# Patient Record
Sex: Female | Born: 1945 | Race: White | Hispanic: No | Marital: Married | Smoking: Never smoker
Health system: Southern US, Community
[De-identification: ages and names within clinical notes are randomized; demographics above are authoritative.]

## PROBLEM LIST (undated history)

## (undated) DIAGNOSIS — Z8489 Family history of other specified conditions: Secondary | ICD-10-CM

## (undated) DIAGNOSIS — C801 Malignant (primary) neoplasm, unspecified: Secondary | ICD-10-CM

## (undated) DIAGNOSIS — Z8619 Personal history of other infectious and parasitic diseases: Secondary | ICD-10-CM

## (undated) DIAGNOSIS — E559 Vitamin D deficiency, unspecified: Secondary | ICD-10-CM

## (undated) HISTORY — PX: OTHER SURGICAL HISTORY: SHX169

## (undated) HISTORY — DX: Personal history of other infectious and parasitic diseases: Z86.19

## (undated) HISTORY — DX: Vitamin D deficiency, unspecified: E55.9

---

## 1998-04-02 ENCOUNTER — Ambulatory Visit (HOSPITAL_COMMUNITY): Admission: RE | Admit: 1998-04-02 | Discharge: 1998-04-02 | Payer: Self-pay | Admitting: *Deleted

## 1998-04-02 ENCOUNTER — Encounter: Payer: Self-pay | Admitting: *Deleted

## 1999-06-29 ENCOUNTER — Other Ambulatory Visit: Admission: RE | Admit: 1999-06-29 | Discharge: 1999-06-29 | Payer: Self-pay | Admitting: *Deleted

## 1999-07-08 ENCOUNTER — Ambulatory Visit (HOSPITAL_COMMUNITY): Admission: RE | Admit: 1999-07-08 | Discharge: 1999-07-08 | Payer: Self-pay | Admitting: *Deleted

## 1999-07-08 ENCOUNTER — Encounter: Payer: Self-pay | Admitting: *Deleted

## 2000-06-27 ENCOUNTER — Other Ambulatory Visit: Admission: RE | Admit: 2000-06-27 | Discharge: 2000-06-27 | Payer: Self-pay | Admitting: *Deleted

## 2001-09-06 ENCOUNTER — Other Ambulatory Visit: Admission: RE | Admit: 2001-09-06 | Discharge: 2001-09-06 | Payer: Self-pay | Admitting: *Deleted

## 2002-12-24 ENCOUNTER — Other Ambulatory Visit: Admission: RE | Admit: 2002-12-24 | Discharge: 2002-12-24 | Payer: Self-pay | Admitting: *Deleted

## 2003-11-06 LAB — HM COLONOSCOPY

## 2003-12-03 ENCOUNTER — Ambulatory Visit (HOSPITAL_COMMUNITY): Admission: RE | Admit: 2003-12-03 | Discharge: 2003-12-03 | Payer: Self-pay | Admitting: Gastroenterology

## 2004-06-25 ENCOUNTER — Other Ambulatory Visit: Admission: RE | Admit: 2004-06-25 | Discharge: 2004-06-25 | Payer: Self-pay | Admitting: *Deleted

## 2005-06-07 HISTORY — PX: COLONOSCOPY: SHX174

## 2007-03-16 ENCOUNTER — Other Ambulatory Visit: Admission: RE | Admit: 2007-03-16 | Discharge: 2007-03-16 | Payer: Self-pay | Admitting: Obstetrics & Gynecology

## 2010-02-16 ENCOUNTER — Ambulatory Visit: Payer: Self-pay | Admitting: Vascular Surgery

## 2010-05-19 ENCOUNTER — Ambulatory Visit: Payer: Self-pay | Admitting: Vascular Surgery

## 2010-06-22 ENCOUNTER — Ambulatory Visit
Admission: RE | Admit: 2010-06-22 | Discharge: 2010-06-22 | Payer: Self-pay | Source: Home / Self Care | Attending: Vascular Surgery | Admitting: Vascular Surgery

## 2010-06-30 ENCOUNTER — Ambulatory Visit: Admit: 2010-06-30 | Payer: Self-pay | Admitting: Vascular Surgery

## 2010-06-30 ENCOUNTER — Ambulatory Visit
Admission: RE | Admit: 2010-06-30 | Discharge: 2010-06-30 | Payer: Self-pay | Source: Home / Self Care | Attending: Vascular Surgery | Admitting: Vascular Surgery

## 2010-07-01 NOTE — Assessment & Plan Note (Signed)
OFFICE VISIT  DEVLYNN, KNOFF DOB:  1946-01-21                                       06/30/2010 [id number]#:[id number]  Patient returns 1 week post laser ablation of her left great saphenous vein with 10-20 stab phlebectomies for painful varicosities in the left thigh and calf.  She has already noted a traumatic decrease in the swelling of her distal calf and ankle.  She has had no pain at the stab phlebectomy sites.  She has had some mild to moderate discomfort at the ablation site in the thigh, both proximally and distally.  She was wearing elastic compression stocking as prescribed.  She took a diminished dose of her ibuprofen.  On physical exam, blood pressure 134/86, heart rate 85, respirations 20. She has some mild to moderate tenderness along the course of the great saphenous vein in the thigh with some mild ecchymosis and no distal edema and well-healed stab phlebectomy sites.  Venous duplex exam reveals closure of the left great saphenous vein up to just proximal to the saphenofemoral junction and no evidence of DVT.  She was reassured regarding these findings, will return in a few weeks to have a similar procedure performed on the contralateral right leg.    Quita Skye Hart [place], M.D. Electronically Signed  JDL/MEDQ  D:  06/30/2010  T:  07/01/2010  Job:  [id number]

## 2010-07-06 NOTE — Procedures (Unsigned)
DUPLEX DEEP VENOUS EXAM - LOWER EXTREMITY  INDICATION:  Left greater saphenous vein ablation.  HISTORY:  Edema:  No. Trauma/Surgery:  Yes. Pain:  Yes. PE:  No. Previous DVT:  No. Anticoagulants:  No. Other:  DUPLEX EXAM:               CFV   SFV   PopV  PTV    GSV               R  L  R  L  R  L  R   L  R  L Thrombosis    o  o     o     o      o     + Spontaneous   +  +     +     +      +     o Phasic        +  +     +     +      +     o Augmentation  +  +     +     +      +     o Compressible  +  +     +     +      +     o Competent     o  o     +     +      +     +  Legend:  + - yes  o - no  p - partial  D - decreased  IMPRESSION: 1. The left greater saphenous vein is thrombosed from the     saphenofemoral junction to the distal insertion site. 2. There is no evidence of deep venous thrombosis noted in the left     lower extremity.   _____________________________ Quita Skye Hart [place], M.D.  EM/MEDQ  D:  06/30/2010  T:  06/30/2010  Job:  [id number]

## 2010-07-27 ENCOUNTER — Other Ambulatory Visit (INDEPENDENT_AMBULATORY_CARE_PROVIDER_SITE_OTHER): Payer: BC Managed Care – PPO | Admitting: Vascular Surgery

## 2010-07-27 DIAGNOSIS — I83893 Varicose veins of bilateral lower extremities with other complications: Secondary | ICD-10-CM

## 2010-07-27 NOTE — Assessment & Plan Note (Signed)
OFFICE VISIT  Susan Taylor, Susan Taylor DOB:  July 30, 1945                                       07/27/2010 [id number]#:[id number]  The patient had laser ablation of her right great saphenous vein from the knee to the saphenofemoral junction with 10-20 stab phlebectomies in the proximal calf and pretibial area.  She tolerated the procedure well and will return in 1 week for venous duplex exam to confirm closure of the right great saphenous vein and this will complete her treatment session.    Quita Skye Hart [place], M.D. Electronically Signed  JDL/MEDQ  D:  07/27/2010  T:  07/27/2010  Job:  [id number]

## 2010-08-03 ENCOUNTER — Ambulatory Visit: Payer: Self-pay | Admitting: Vascular Surgery

## 2010-08-04 ENCOUNTER — Ambulatory Visit (INDEPENDENT_AMBULATORY_CARE_PROVIDER_SITE_OTHER): Payer: BC Managed Care – PPO | Admitting: Vascular Surgery

## 2010-08-04 ENCOUNTER — Encounter (INDEPENDENT_AMBULATORY_CARE_PROVIDER_SITE_OTHER): Payer: BC Managed Care – PPO

## 2010-08-04 DIAGNOSIS — I83893 Varicose veins of bilateral lower extremities with other complications: Secondary | ICD-10-CM

## 2010-08-04 DIAGNOSIS — Z48812 Encounter for surgical aftercare following surgery on the circulatory system: Secondary | ICD-10-CM

## 2010-08-05 NOTE — Assessment & Plan Note (Signed)
OFFICE VISIT  Susan Taylor, Susan Taylor DOB:  [birth year]                                       08/04/2010 EAVWU#:[id number]  The patient returns today 1 week post laser ablation of her right great saphenous vein with multiple stab phlebectomies for painful varicosities secondary to valvular reflux in the right great saphenous system.  She tolerated the procedure well and has had minimal to moderate discomfort in the right thigh, which is now resolving.  She also had some bruising in the right thigh.  She has had minimal discomfort in the stab phlebectomy sites below the knee.  She has had no distal edema but has worn her elastic compression stocking.  She took ibuprofen as instructed.  On exam today blood pressure 142/86, heart rate 72, respirations 20. Her right leg has some mild to moderate ecchymosis with the great saphenous vein palpable beneath the skin with mild tenderness.  There is no distal edema, and the stab phlebectomy sites have healed nicely.  I ordered a venous duplex exam today, which I reviewed and interpreted. There is total closure of the right great saphenous vein from the knee to the saphenofemoral junction with no evidence of DVT.  I reassured her regarding these findings.  She will wear the stockings for one more week and then as necessary in the future, return to see [place] on a p.r.n. basis.    Quita Skye Hart [place], M.D. Electronically Signed  JDL/MEDQ  D:  08/04/2010  T:  08/05/2010  Job:  [id number]

## 2010-08-11 NOTE — Procedures (Unsigned)
DUPLEX DEEP VENOUS EXAM - LOWER EXTREMITY  INDICATION:  Status post right GSV ablation.  HISTORY:  Edema:  Yes. Trauma/Surgery:  Ablation. Pain:  Yes. PE:  No. Previous DVT:  No. Anticoagulants: Other:  DUPLEX EXAM:               CFV   SFV   PopV  PTV    GSV               R  L  R  L  R  L  R   L  R  L Thrombosis    o  o  o     o     o      + Spontaneous   +  +  +     +     +      o Phasic        +  +  +     +     +      o Augmentation  +  +  +     +     +      o Compressible  +  +  +     +     +      o Competent     +  +  +     +     +      o  Legend:  + - yes  o - no  p - partial  D - decreased  IMPRESSION:  Apparently successful ablation of right greater saphenous vein without evidence of deep vein involvement.   _____________________________ Quita Skye Hart [place], M.D.  LT/MEDQ  D:  08/04/2010  T:  08/04/2010  Job:  [id number]

## 2010-10-20 NOTE — Consult Note (Signed)
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  These have  become increasingly symptomatic.  She stands all day during her job and  develops aching, throbbing, burning discomfort in the calves as the day  progresses.  She does not wear long-leg elastic compression stockings  and she is not able to elevate her legs at work, and does not take pain  medication.  She has no history of thrombophlebitis of deep venous  thrombosis, bleeding, or ulceration; but, mainly has been bothered by  increasing throbbing and aching discomfort as the day progresses.   CHRONIC MEDICAL PROBLEMS:  1. Hyperlipidemia.  2. Negative for diabetes, coronary artery disease, COPD, or stroke.   FAMILY HISTORY:  Positive for varicose veins in her mother and brother.  Negative for diabetes and stroke.   SOCIAL HISTORY:  She is married and has 2 children.  Works as an Psychologist, prison and probation services at [employer].  Has not smoked since 1970.  Drinks occasional alcohol.   REVIEW OF SYSTEMS:  Positive for colitis but otherwise all systems are  negative in the review of systems.   PHYSICAL EXAMINATION:  Blood pressure 124/81, heart rate 78, temperature  97.3.  General:  Well-developed, well-nourished female in no apparent  distress, alert and oriented x3.  HEENT exam is normal.  EOMs intact.  Lungs:  Clear to auscultation.  No rhonchi or wheezing.  Cardiovascular:  Regular rhythm.  No murmur.  Carotid pulses 3+, no bruits.  Abdomen:  Soft, nontender with no masses.  Musculoskeletal exam is free of major  deformities.  Neurologic exam is normal.  Lower extremity exam reveals  3+ femoral, popliteal, and dorsalis pedis pulses bilaterally.  Left leg  has bulging varicosities beginning at  the knee, medially over the great  saphenous system down to the ankle level and also posteriorly.  There is  minimal distal edema but a few reticular veins in the ankle.  The right  leg has a prominent pattern of bulging varicosities in the pretibial  area extending just below the knee in a C-shape around the pretibial  area.  She has some smaller varicosities in the distal calf on the  right.   Today, I ordered a venous duplex exam which I reviewed and interpreted.  She does have gross reflux in both great saphenous systems from the  saphenofemoral junction to the knee supplying these varicosities.  She  also has some reflux in the deep systems bilaterally.  Both small  saphenous veins are normal.   I think she does have venous insufficiency which is affecting her daily  living and ability to teach because of the symptoms which occur while  standing.  I have recommended long-leg elastic compression stockings (20  mm-30 mm gradient) as well as elevation at home when she can and  ibuprofen on a regular basis.  I will see her in 3 months.  If there has  been no improvement, I think we should proceed with:  1. Laser ablation of left great saphenous vein with multiple stab      phlebectomies to  be followed by #2.  2. Laser ablation of the right great saphenous vein with multiple stab      phlebectomies.     Quita Skye Hart Rochester, M.D.  Electronically Signed   JDL/MEDQ  D:  02/16/2010  T:  02/17/2010  Job:  [id number]

## 2010-10-20 NOTE — Assessment & Plan Note (Signed)
OFFICE VISIT   LEVITA, MONICAL  DOB:  June 24, 1945                                       05/19/2010  WUJWJ#:[id number]   Patient returns today for follow-up regarding her venous insufficiency  of both lower extremities.  She has painful bulging varicosities in both  legs in the distal thigh and medial calf areas on the right side in the  pretibial area, which have caused increasing aching, throbbing, burning  discomfort which worsens as the day progresses.  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  She has been wearing long-leg elastic  compression stockings (20-30 mm gradient).  She cannot elevate her legs  at work but does take pain medication and has no change in her  symptomatology, which continues to worsen.  She has no history of DVT or  thrombophlebitis or stasis ulcers.   On exam today, blood pressure 110/72, heart rate 64, respirations 24.  Chest:  Clear to auscultation.  Generally, she is a well-developed, well-  nourished female in no apparent distress.  Lower extremity exam on the  right revealed bulging varicosities in the distal most thigh, extending  anteriorly in the lateral to the knee and the pretibial area and on the  left in the great saphenous distribution below the knee.  There are  bulging varicosities with 1-2+ edema on the left, no edema on the right.   Venous duplex exam done at her last visit reveals gross reflux  throughout both lower extremity systems, including the saphenofemoral  junction with numerous varicosities..   I think the best plan for this lady who has a symptomatic varicosities,  which are clearly affecting her daily living and ability to work should  be:  1.  Laser ablation of the left great saphenous vein with multiple  stab phlebectomies to be followed by:  2.  Laser ablation of the right  great saphenous vein with multiple stab phlebectomies.   We will proceed with precertification for this in the near  future.     Quita Skye Hart Rochester, M.D.  Electronically Signed   JDL/MEDQ  D:  05/19/2010  T:  05/20/2010  Job:  [id number]

## 2010-10-20 NOTE — Procedures (Signed)
LOWER EXTREMITY VENOUS REFLUX EXAM   INDICATION:  Varicose veins.   EXAM:  Using color-flow imaging and pulse Doppler spectral analysis, the  right and left common femoral, superficial femoral, popliteal, posterior  tibial, greater and lesser saphenous veins are evaluated.  There is  evidence suggesting deep venous insufficiency in the right and left  lower extremities.   The right and left saphenofemoral junction is not competent with Reflux  of >584milliseconds. The right and left GSV is not competent with Reflux  of >583milliseconds with the caliber as described below.   The right and left proximal short saphenous vein demonstrates  competency.   GSV Diameter (used if found to be incompetent only)                                            Right    Left  Proximal Greater Saphenous Vein           0.66 cm  0.88 cm  Proximal-to-mid-thigh                     cm       cm  Mid thigh                                 0.44 cm  0.62 cm  Mid-distal thigh                          cm       cm  Distal thigh                              0.48 cm  0.60 cm  Knee                                      0.45 cm  0.64 cm   IMPRESSION:  1. The right and left greater saphenous veins are not competent with      reflux with >523milliseconds.  2. The right and left greater saphenous veins are not tortuous.  3. The deep venous system is not competent with Reflux of      >560milliseconds.  4. The right and left short saphenous vein is competent.   ___________________________________________  Quita Skye. Hart [place], M.D.   EM/MEDQ  D:  02/16/2010  T:  02/16/2010  Job:  [id number]

## 2010-10-20 NOTE — Assessment & Plan Note (Signed)
OFFICE VISIT   Susan Taylor, Susan Taylor  DOB:  04/23/1946                                       06/22/2010  RUEAV#:[id number]   The patient had laser ablation of her left great saphenous vein with 10  to 20 stab phlebectomies for painful varicosities secondary to saphenous  reflux.  She tolerated the procedure well, will return in 1 week for a  venous duplex exam to confirm closure and then will be scheduled to have  the contralateral right side done.     Quita Skye Hart Rochester, M.D.  Electronically Signed   JDL/MEDQ  D:  06/22/2010  T:  06/23/2010  Job:  [id number]

## 2010-10-23 NOTE — Op Note (Signed)
NAME:  Susan Taylor, Susan Taylor                        ACCOUNT NO.:  [id number]   MEDICAL RECORD NO.:  [id number]                   PATIENT TYPE:  AMB   LOCATION:  ENDO                                 FACILITY:  [facility]   PHYSICIAN:  Graylin Shiver, M.D.                DATE OF BIRTH:  20-Jan-1946   DATE OF PROCEDURE:  12/03/2003  DATE OF DISCHARGE:                                 OPERATIVE REPORT   PROCEDURE:  Colonoscopy.   INDICATIONS FOR PROCEDURE:  Screening.   Informed consent was obtained after explanation of the risks of bleeding,  infection and perforation.   PREMEDICATION:  Fentanyl 100 mcg IV, Versed 10 mg IV.   DESCRIPTION OF PROCEDURE:  With the patient in the left lateral decubitus  position, a rectal exam was performed and no masses were felt. The Olympus  pediatric adjustable colonoscope was inserted into the rectum and advanced  around an extremely tortuous colon to the cecum. The patient had to be  placed on her right side and on her back to achieved cecal intubation.  The  cecum and ascending colon were normal. The transverse colon was normal.  The  descending colon, sigmoid and rectum were normal. She tolerated the  procedure well without complications.   IMPRESSION:  Normal colonoscopy to the cecum.  The colon is extremely  tortuous.   I would recommend a followup colonoscopy again in 10 years.                                               Graylin Shiver, M.D.    Germain Osgood  D:  12/03/2003  T:  12/03/2003  Job:  [id number]   cc:   Marcene Duos, M.D.  [address]  [place]  [place] [address]  Fax: [phone]

## 2011-09-20 LAB — HM MAMMOGRAPHY

## 2012-02-14 ENCOUNTER — Encounter: Payer: Self-pay | Admitting: Internal Medicine

## 2012-02-14 ENCOUNTER — Telehealth: Payer: Self-pay | Admitting: Internal Medicine

## 2012-02-14 ENCOUNTER — Ambulatory Visit (INDEPENDENT_AMBULATORY_CARE_PROVIDER_SITE_OTHER): Payer: Medicare Other | Admitting: Internal Medicine

## 2012-02-14 VITALS — BP 134/80 | HR 98 | Temp 98.6°F | Ht 66.25 in | Wt 178.0 lb

## 2012-02-14 DIAGNOSIS — Z818 Family history of other mental and behavioral disorders: Secondary | ICD-10-CM

## 2012-02-14 DIAGNOSIS — R030 Elevated blood-pressure reading, without diagnosis of hypertension: Secondary | ICD-10-CM

## 2012-02-14 DIAGNOSIS — Z8249 Family history of ischemic heart disease and other diseases of the circulatory system: Secondary | ICD-10-CM

## 2012-02-14 DIAGNOSIS — Z820 Family history of epilepsy and other diseases of the nervous system: Secondary | ICD-10-CM

## 2012-02-14 DIAGNOSIS — Z7189 Other specified counseling: Secondary | ICD-10-CM

## 2012-02-14 DIAGNOSIS — Z7689 Persons encountering health services in other specified circumstances: Secondary | ICD-10-CM

## 2012-02-14 DIAGNOSIS — E7800 Pure hypercholesterolemia, unspecified: Secondary | ICD-10-CM

## 2012-02-14 NOTE — Patient Instructions (Addendum)
Check your blood pressure readings ocassionaly 1-3 x per week is plenty .       DASH  Diet DASH Diet The DASH diet stands for "Dietary Approaches to Stop Hypertension." It is a healthy eating plan that has been shown to reduce high blood pressure (hypertension) in as little as 14 days, while also possibly providing other significant health benefits. These other health benefits include reducing the risk of breast cancer after menopause and reducing the risk of type 2 diabetes, heart disease, colon cancer, and stroke. Health benefits also include weight loss and slowing kidney failure in patients with chronic kidney disease.  DIET GUIDELINES  Limit salt (sodium). Your diet should contain less than 1500 mg of sodium daily.   Limit refined or processed carbohydrates. Your diet should include mostly whole grains. Desserts and added sugars should be used sparingly.   Include small amounts of heart-healthy fats. These types of fats include nuts, oils, and tub margarine. Limit saturated and trans fats. These fats have been shown to be harmful in the body.  CHOOSING FOODS  The following food groups are based on a 2000 calorie diet. See your Registered Dietitian for individual calorie needs. Grains and Grain Products (6 to 8 servings daily)  Eat More Often: Whole-wheat bread, brown rice, whole-grain or wheat pasta, quinoa, popcorn without added fat or salt (air popped).   Eat Less Often: White bread, white pasta, white rice, cornbread.  Vegetables (4 to 5 servings daily)  Eat More Often: Fresh, frozen, and canned vegetables. Vegetables may be raw, steamed, roasted, or grilled with a minimal amount of fat.   Eat Less Often/Avoid: Creamed or fried vegetables. Vegetables in a cheese sauce.  Fruit (4 to 5 servings daily)  Eat More Often: All fresh, canned (in natural juice), or frozen fruits. Dried fruits without added sugar. One hundred percent fruit juice ( cup [237 mL] daily).   Eat Less Often:  Dried fruits with added sugar. Canned fruit in light or heavy syrup.  [employer], Fish, and Poultry (2 servings or less daily. One serving is 3 to 4 oz [85-114 g]).  Eat More Often: Ninety percent or leaner ground beef, tenderloin, sirloin. Round cuts of beef, chicken breast, [place] breast. All fish. Grill, bake, or broil your meat. Nothing should be fried.   Eat Less Often/Avoid: Fatty cuts of meat, [place], or chicken leg, thigh, or wing. Fried cuts of meat or fish.  Dairy (2 to 3 servings)  Eat More Often: Low-fat or fat-free milk, low-fat plain or light yogurt, reduced-fat or part-skim cheese.   Eat Less Often/Avoid: Milk (whole, 2%, skim, or chocolate).Whole milk yogurt. Full-fat cheeses.  Nuts, Seeds, and Legumes (4 to 5 servings per week)  Eat More Often: All without added salt.   Eat Less Often/Avoid: Salted nuts and seeds, canned beans with added salt.  Fats and Sweets (limited)  Eat More Often: Vegetable oils, tub margarines without trans fats, sugar-free gelatin. Mayonnaise and salad dressings.   Eat Less Often/Avoid: Coconut oils, palm oils, butter, stick margarine, cream, half and half, cookies, candy, pie.  FOR MORE INFORMATION The Dash Diet Eating Plan: www.dashdiet.org Document Released: 05/13/2011 Document Reviewed: 05/03/2011 [facility] Patient Information 2012 [place], [place].   Flu shot   Shingles vaccine Pneumovax  All vaccines  Advised call for  Injection only appt for this when ready.  Lab and OV in 6 months or as needed.  Check  Into last labs for [place] to review.

## 2012-02-14 NOTE — Telephone Encounter (Signed)
What labs did you order?

## 2012-02-14 NOTE — Telephone Encounter (Signed)
Pt is sch for fasting labs please put order in system

## 2012-02-14 NOTE — Progress Notes (Signed)
  Subjective:    Patient ID: Susan Taylor, female    DOB: [birth year], 66 y.o.   MRN: [id number]  HPI Patient comes in as new patient visit . Previous care was  Deboraha Sprang  Last seen over a year ago.  Has large amt of doc turnover and  Not seen recetly. See Dr Thurnell Lose  .  On no rx meds at this time. 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  Concerned of risk .  Has hx of high  ldl and hdl      BP readings are reported nl at  Home and no dx of ht but has been up at times  [facility] at Dr Rosalio Macadamia office in the past year.  Says she is utd on colon 7=8 years ago pap this yea ras well as mammo.  Review of Systems ROS:  GEN/ HEENT: No fever, significant weight changes sweats headaches vision problems hearing changes, CV/ PULM; No chest pain shortness of breath cough, syncope,edema  change in exercise tolerance. GI /GU: No adominal pain, vomiting, change in bowel habits. No blood in the stool. No significant GU symptoms. SKIN/HEME: ,no acute skin rashes suspicious lesions or bleeding. No lymphadenopathy, nodules, masses.  NEURO/ PSYCH:  No neurologic signs such as weakness numbness. No depression anxiety. IMM/ Allergy: No unusual infections.  Allergy .   REST of 12 system review negative except as per HPI Past history family history social history data entered  in the electronic medical record.      Objective:   Physical Exam BP 134/80  Pulse 98  Temp 98.6 F (37 C) (Oral)  Ht 5' 6.25" (1.683 m)  Wt 178 lb (80.74 kg)  BMI 28.51 kg/m2  SpO2 98%  Repeat BP right  140/84  Right and 134/80 WDWN healthy appearing in nad HEENT grossly nl op clear eyes clear  Neck: Supple without adenopathy or masses or bruits Chest:  Clear to A&P without wheezes rales or rhonchi CV:  S1-S2 no gallops or murmurs peripheral perfusion is normal Abdomen:  Sof,t normal bowel sounds without  hepatosplenomegaly, no guarding rebound or masses no CVA tenderness No clubbing cyanosis or edema MS mild scoliosis   noacute findings  Neuro NON focal grossly  Gait normal .     Assessment & Plan:   Bp elevation s poss white coat.  Reviewed data infor  on dash has fam hx of poss vascular dementia and ht   caretaking  Role new LiPIDS:  Apparently  Ok ratio  But should get check  In next year  Get copy of her last labs to [place].  Family hx of vascular dementia and ht.  HCM  Disc  immuniz flu zostavax and pneumovax    Pt will wait and reschedule later.  Counseled. lifestyle intervention healthy eating and exercise . Dash diet   bp monitoring  And risk reduction . Plan ov in about  4-6 months and labs previsit . Contact us in interim if elevated bp readings at home.

## 2012-02-16 NOTE — Telephone Encounter (Signed)
I put  Future orders in the system.

## 2012-02-17 NOTE — Telephone Encounter (Signed)
done

## 2012-03-06 ENCOUNTER — Encounter: Payer: Self-pay | Admitting: Internal Medicine

## 2012-03-06 DIAGNOSIS — Z8249 Family history of ischemic heart disease and other diseases of the circulatory system: Secondary | ICD-10-CM | POA: Insufficient documentation

## 2012-03-06 DIAGNOSIS — Z818 Family history of other mental and behavioral disorders: Secondary | ICD-10-CM | POA: Insufficient documentation

## 2012-03-06 NOTE — Assessment & Plan Note (Signed)
Monitor lsi fu treatment as appropriate if not at goal

## 2012-08-08 ENCOUNTER — Other Ambulatory Visit: Payer: BC Managed Care – PPO

## 2012-08-15 ENCOUNTER — Ambulatory Visit: Payer: BC Managed Care – PPO | Admitting: Internal Medicine

## 2012-11-14 ENCOUNTER — Encounter: Payer: Self-pay | Admitting: Obstetrics & Gynecology

## 2012-11-14 ENCOUNTER — Ambulatory Visit (INDEPENDENT_AMBULATORY_CARE_PROVIDER_SITE_OTHER): Payer: Medicare PPO | Admitting: Obstetrics & Gynecology

## 2012-11-14 VITALS — BP 120/84 | HR 76 | Temp 98.0°F | Resp 16 | Ht 66.5 in | Wt 174.0 lb

## 2012-11-14 DIAGNOSIS — N8111 Cystocele, midline: Secondary | ICD-10-CM

## 2012-11-14 DIAGNOSIS — Z01419 Encounter for gynecological examination (general) (routine) without abnormal findings: Secondary | ICD-10-CM

## 2012-11-14 DIAGNOSIS — IMO0002 Reserved for concepts with insufficient information to code with codable children: Secondary | ICD-10-CM

## 2012-11-14 NOTE — Patient Instructions (Signed)

## 2012-11-14 NOTE — Progress Notes (Signed)
Patient ID: Susan Taylor, female   DOB: [birth year], 67 y.o.   MRN: [id number]  67 y.o. G2P2 MarriedCaucasianF here for annual exam.  Doing well.  No vaginal bleeding.  Has appointment on thurs with Dr. Fabian Sharp.  No LMP recorded. Patient is postmenopausal.          Sexually active: yes  The current method of family planning is post menopausal status.    Exercising: yes  Walking 5-7 days per week Smoker:  no  Health Maintenance: Pap:  07/26/11 History of abnormal Pap:  no MMG:  09/20/11 Colonoscopy:  11/2003, repeat 10 years BMD:   04/10/10, -1.4 hip frax 1.3%/14% TDaP:  03/2007 Screening Labs: PCP, Hb today: PCP, Urine today: PCP   reports that she has never smoked. She has never used smokeless tobacco. She reports that she drinks about 0.5 ounces of alcohol per week. She reports that she does not use illicit drugs.  Past Medical History  Diagnosis Date  . Hx of varicella   . Vitamin D deficiency     Past Surgical History  Procedure Laterality Date  . Squamous cell carcinoma removal      Current Outpatient Prescriptions  Medication Sig Dispense Refill  . aspirin 81 MG tablet Take 81 mg by mouth daily.      . Calcium Carbonate (CALCIUM 500 PO) Take 1 tablet by mouth daily.      . Cholecalciferol (VITAMIN D PO) Take 1 tablet by mouth daily. Take 2000mg  daily.       No current facility-administered medications for this visit.    Family History  Problem Relation Age of Onset  . Arthritis Mother   . Hearing loss Mother   . Heart disease Mother   . Hyperlipidemia Mother   . Hypertension Mother   . Miscarriages / [place] Mother   . Stroke Mother   . Alcohol abuse Father     ROS:  Pertinent items are noted in HPI.  Otherwise, a comprehensive ROS was negative.  Exam:   BP 120/84  Pulse 76  Temp(Src) 98 F (36.7 C) (Oral)  Resp 16  Ht 5' 6.5" (1.689 m)  Wt 174 lb (78.926 kg)  BMI 27.67 kg/m2  Weight change:-3lb    Height: 5' 6.5" (168.9 cm)  Ht Readings from Last  3 Encounters:  11/14/12 5' 6.5" (1.689 m)  02/14/12 5' 6.25" (1.683 m)    General appearance: alert, cooperative and appears stated age Head: Normocephalic, without obvious abnormality, atraumatic Neck: no adenopathy, supple, symmetrical, trachea midline and thyroid normal to inspection and palpation Lungs: clear to auscultation bilaterally Breasts: normal appearance, no masses or tenderness Heart: regular rate and rhythm Abdomen: soft, non-tender; bowel sounds normal; no masses,  no organomegaly Extremities: extremities normal, atraumatic, no cyanosis or edema Skin: Skin color, texture, turgor normal. No rashes or lesions Lymph nodes: Cervical, supraclavicular, and axillary nodes normal. No abnormal inguinal nodes palpated Neurologic: Grossly normal   Pelvic: External genitalia:  no lesions              Urethra:  normal appearing urethra with no masses, tenderness or lesions              Bartholins and Skenes: normal                 Vagina: normal appearing vagina with normal color and discharge, no lesions, 2nd to 3rd degree cystocele noted today on exam  Cervix: no lesions              Pap taken: no Bimanual Exam:  Uterus:  normal size, contour, position, consistency, mobility, non-tender              Adnexa: normal adnexa and no mass, fullness, tenderness               Rectovaginal: Confirms               Anus:  normal sphincter tone, no lesions  A:  Well Woman with normal exam H/O SCC right lower extremity 11/12 Cystocele noted today on exam.  D/W pt this finding.  Brochure on pelvic support problems provided.  PT discussed.  Declines today.  Will monitor for symptoms changes. Mild osteopenia  P:   Mammogram scheduled today for patient--6/12 at 2:45pm Labs later this week with Dr. Fabian Sharp pap smear not done. Neg with neg HR HPV 2/13. return annually or prn  An After Visit Summary was printed and given to the patient.

## 2012-11-16 ENCOUNTER — Other Ambulatory Visit (INDEPENDENT_AMBULATORY_CARE_PROVIDER_SITE_OTHER): Payer: Medicare PPO

## 2012-11-16 DIAGNOSIS — E7800 Pure hypercholesterolemia, unspecified: Secondary | ICD-10-CM

## 2012-11-16 DIAGNOSIS — R030 Elevated blood-pressure reading, without diagnosis of hypertension: Secondary | ICD-10-CM

## 2012-11-16 DIAGNOSIS — Z820 Family history of epilepsy and other diseases of the nervous system: Secondary | ICD-10-CM

## 2012-11-16 DIAGNOSIS — Z818 Family history of other mental and behavioral disorders: Secondary | ICD-10-CM

## 2012-11-16 DIAGNOSIS — Z8249 Family history of ischemic heart disease and other diseases of the circulatory system: Secondary | ICD-10-CM

## 2012-11-16 LAB — BASIC METABOLIC PANEL
Chloride: 102 mEq/L (ref 96–112)
Potassium: 4.9 mEq/L (ref 3.5–5.1)
Sodium: 137 mEq/L (ref 135–145)

## 2012-11-16 LAB — CBC WITH DIFFERENTIAL/PLATELET
Basophils Relative: 0.5 % (ref 0.0–3.0)
Eosinophils Absolute: 0.1 10*3/uL (ref 0.0–0.7)
Hemoglobin: 14.4 g/dL (ref 12.0–15.0)
MCHC: 33.7 g/dL (ref 30.0–36.0)
MCV: 95.7 fl (ref 78.0–100.0)
Monocytes Absolute: 0.4 10*3/uL (ref 0.1–1.0)
Neutro Abs: 2.5 10*3/uL (ref 1.4–7.7)
RBC: 4.47 Mil/uL (ref 3.87–5.11)
RDW: 13.2 % (ref 11.5–14.6)

## 2012-11-16 LAB — LIPID PANEL
Cholesterol: 242 mg/dL — ABNORMAL HIGH (ref 0–200)
Triglycerides: 51 mg/dL (ref 0.0–149.0)
VLDL: 10.2 mg/dL (ref 0.0–40.0)

## 2012-11-16 LAB — HEPATIC FUNCTION PANEL
Albumin: 4.4 g/dL (ref 3.5–5.2)
Total Protein: 7.2 g/dL (ref 6.0–8.3)

## 2012-11-16 LAB — LDL CHOLESTEROL, DIRECT: Direct LDL: 143.2 mg/dL

## 2012-11-23 ENCOUNTER — Ambulatory Visit (INDEPENDENT_AMBULATORY_CARE_PROVIDER_SITE_OTHER): Payer: Medicare PPO | Admitting: Internal Medicine

## 2012-11-23 ENCOUNTER — Encounter: Payer: Self-pay | Admitting: Internal Medicine

## 2012-11-23 VITALS — BP 130/86 | HR 75 | Temp 97.9°F | Wt 172.0 lb

## 2012-11-23 DIAGNOSIS — Z8249 Family history of ischemic heart disease and other diseases of the circulatory system: Secondary | ICD-10-CM

## 2012-11-23 DIAGNOSIS — E7800 Pure hypercholesterolemia, unspecified: Secondary | ICD-10-CM

## 2012-11-23 DIAGNOSIS — R030 Elevated blood-pressure reading, without diagnosis of hypertension: Secondary | ICD-10-CM

## 2012-11-23 NOTE — Patient Instructions (Addendum)
Continue lifestyle intervention healthy eating and exercise . Continue monitor BP readings  As we discussed.  Consider pneumovax  As we discussed  Get yearly flu vaccine. Look into shingles vaccine  zostavax   Coverage and can return for  This at any time .  Consider  Cross training for exercise.   See dr fields sports medicine if having difficulty maintaining exercise .  Can monitor labs every few years   . Blood pressure readings every year.

## 2012-11-23 NOTE — Progress Notes (Signed)
Chief Complaint  Patient presents with  . Follow-up    HPI: Pt comes in today for "FU"   Seen one last year and fu with screening labs  And fu of bp and lipids. Has hx of elevated lipids in family and personal . But no personal cv disease. Has had her gyne check in the meantime  Today feeling fine  . hasnt checked bp recently as it had been good . Fu lab review ; No cv pulm sx or exercise limitation exceptrelated to old foot injury. Foot  Hx of fracture.  Popping  And gets sore with lots of  Walking .   ROS: See pertinent positives and negatives per HPI. No cv pulm gi gu sx ent sx at present Functional status good  All adls no dep anxiety   Past Medical History  Diagnosis Date  . Hx of varicella   . Vitamin D deficiency     Family History  Problem Relation Age of Onset  . Arthritis Mother   . Hearing loss Mother   . Heart disease Mother   . Hyperlipidemia Mother   . Hypertension Mother   . Miscarriages / [place] Mother   . Stroke Mother   . Alcohol abuse Father     History   Social History  . Marital Status: Married    Spouse Name: N/A    Number of Children: N/A  . Years of Education: N/A   Social History Main Topics  . Smoking status: Never Smoker   . Smokeless tobacco: Never Used  . Alcohol Use: .5 - 2 oz/week    1-4 drink(s) per week  . Drug Use: No  . Sexually Active: Yes -- Female partner(s)    Birth Control/ Protection: Post-menopausal   Other Topics Concern  . 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 3 miles    G2P2   7-8 hours sleep   Social etoh 1-4 per week              Outpatient Encounter Prescriptions as of 11/23/2012  Medication Sig Dispense Refill  . aspirin 81 MG tablet Take 81 mg by mouth daily.      . Calcium Carbonate (CALCIUM 500 PO) Take 1 tablet by mouth daily.      .  Cholecalciferol (VITAMIN D PO) Take 1 tablet by mouth daily. Take 2000mg  daily.       No facility-administered encounter medications on file as of 11/23/2012.    EXAM:  BP 130/86  Pulse 75  Temp(Src) 97.9 F (36.6 C) (Oral)  Wt 172 lb (78.019 kg)  BMI 27.35 kg/m2  SpO2 98%  Body mass index is 27.35 kg/(m^2).  GENERAL: vitals reviewed and listed above, alert, oriented, appears well hydrated and in no acute distress Repeat bp better right tarm sitting  HEENT: atraumatic, conjunctiva  clear, no obvious abnormalities on inspection of external nose and ears  NECK: no obvious masses on inspection palpation  noadenopathy CV: HRRR, no clubbing cyanosis or  peripheral edema nl cap refill  MS: moves all extremities without noticeable focal grossly  Abnormality gail normal  PSYCH: pleasant and cooperative, no obvious depression or anxiety Lab Results  Component Value Date   WBC 4.9 11/16/2012   HGB  14.4 11/16/2012   HCT 42.8 11/16/2012   PLT 256.0 11/16/2012   GLUCOSE 87 11/16/2012   CHOL 242* 11/16/2012   TRIG 51.0 11/16/2012   HDL 96.90 11/16/2012   LDLDIRECT 143.2 11/16/2012   ALT 20 11/16/2012   AST 24 11/16/2012   NA 137 11/16/2012   K 4.9 11/16/2012   CL 102 11/16/2012   CREATININE 0.9 11/16/2012   BUN 16 11/16/2012   CO2 23 11/16/2012   TSH 3.17 11/16/2012    ASSESSMENT AND PLAN:  Discussed the following assessment and plan:  Elevated cholesterol - hihg hdl also  LSI  to attend  Family history of vascular disease mom   Elevated blood pressure - better on repeat  contine life style  habits  Counseled. About immunization advise per protochol   Pneumovax will wait on this  And shingles etc  tdap ? May not be reimbursed but medicare plans   -Patient advised to return or notify health care team  if symptoms worsen or persist or new concerns arise.  Patient Instructions  Continue lifestyle intervention healthy eating and exercise . Continue monitor BP readings  As we discussed.   Consider pneumovax  As we discussed  Get yearly flu vaccine. Look into shingles vaccine  zostavax   Coverage and can return for  This at any time .  Consider  Cross training for exercise.   See dr fields sports medicine if having difficulty maintaining exercise .  Can monitor labs every few years   . Blood pressure readings every year.        Neta Mends. Peggi Yono M.D. Total visit > 50% spent counseling and coordinating care

## 2014-01-01 ENCOUNTER — Ambulatory Visit: Payer: Medicare PPO | Admitting: Obstetrics & Gynecology

## 2014-04-08 ENCOUNTER — Encounter: Payer: Self-pay | Admitting: Internal Medicine

## 2014-07-12 ENCOUNTER — Encounter: Payer: Self-pay | Admitting: Family Medicine

## 2014-07-12 ENCOUNTER — Telehealth: Payer: Self-pay | Admitting: Internal Medicine

## 2014-07-12 ENCOUNTER — Ambulatory Visit (INDEPENDENT_AMBULATORY_CARE_PROVIDER_SITE_OTHER): Payer: Medicare PPO | Admitting: Family Medicine

## 2014-07-12 VITALS — BP 148/98 | HR 80 | Temp 98.3°F | Ht 66.5 in | Wt 180.0 lb

## 2014-07-12 DIAGNOSIS — H00016 Hordeolum externum left eye, unspecified eyelid: Secondary | ICD-10-CM

## 2014-07-12 MED ORDER — NEOMYCIN-POLYMYXIN-DEXAMETH 3.5-10000-0.1 OP SUSP: 3.0000 [drp] | OPHTHALMIC | Status: DC | PRN

## 2014-07-12 MED ORDER — NEOMYCIN-POLYMYXIN-HC 3.5-10000-1 OP SUSP: 3.0000 [drp] | OPHTHALMIC | Status: DC

## 2014-07-12 NOTE — Progress Notes (Signed)
Pre visit review using our clinic review tool, if applicable. No additional management support is needed unless otherwise documented below in the visit note. 

## 2014-07-12 NOTE — Progress Notes (Signed)
   Subjective:    Patient ID: Susan Taylor, female    DOB: 1946-01-18, 69 y.o.   MRN: [id number]  HPI Here for one week of a painful swelling in the left lower lid. She thinks it is a stye. Using hot compresses several times a day. It does drain at times.    Review of Systems  Constitutional: Negative.   Eyes: Positive for pain, discharge and redness. Negative for photophobia and visual disturbance.       Objective:   Physical Exam  Constitutional: She appears well-developed and well-nourished.  Eyes:  The left lower lid is mildly swollen and the there is a small tender stye on the inside of the lid   Lymphadenopathy:    She has no cervical adenopathy.          Assessment & Plan:  Use Cortisporin drops and continue the hot compresses.

## 2014-07-12 NOTE — Telephone Encounter (Signed)
Pharmacy does not have this medication in stock, can you change to Maxitrol, dosage & directions are the same. Per Dr. Sarajane Jews, okay to call in. I did call pharmacy and give the verbal order for the change.

## 2014-07-12 NOTE — Telephone Encounter (Signed)
Pharm is calling needing clarification on cortisporin

## 2015-03-19 DIAGNOSIS — Z85828 Personal history of other malignant neoplasm of skin: Secondary | ICD-10-CM | POA: Diagnosis not present

## 2015-03-19 DIAGNOSIS — D485 Neoplasm of uncertain behavior of skin: Secondary | ICD-10-CM | POA: Diagnosis not present

## 2015-03-19 DIAGNOSIS — L570 Actinic keratosis: Secondary | ICD-10-CM | POA: Diagnosis not present

## 2015-03-19 DIAGNOSIS — D225 Melanocytic nevi of trunk: Secondary | ICD-10-CM | POA: Diagnosis not present

## 2015-03-19 DIAGNOSIS — Z872 Personal history of diseases of the skin and subcutaneous tissue: Secondary | ICD-10-CM | POA: Diagnosis not present

## 2015-03-19 DIAGNOSIS — L719 Rosacea, unspecified: Secondary | ICD-10-CM | POA: Diagnosis not present

## 2015-03-19 DIAGNOSIS — L821 Other seborrheic keratosis: Secondary | ICD-10-CM | POA: Diagnosis not present

## 2015-04-16 ENCOUNTER — Ambulatory Visit (INDEPENDENT_AMBULATORY_CARE_PROVIDER_SITE_OTHER): Payer: Medicare PPO

## 2015-04-16 DIAGNOSIS — Z23 Encounter for immunization: Secondary | ICD-10-CM | POA: Diagnosis not present

## 2015-06-11 ENCOUNTER — Other Ambulatory Visit (INDEPENDENT_AMBULATORY_CARE_PROVIDER_SITE_OTHER): Payer: Medicare Other

## 2015-06-11 DIAGNOSIS — Z0000 Encounter for general adult medical examination without abnormal findings: Secondary | ICD-10-CM | POA: Diagnosis not present

## 2015-06-11 DIAGNOSIS — E785 Hyperlipidemia, unspecified: Secondary | ICD-10-CM | POA: Diagnosis not present

## 2015-06-11 LAB — CBC WITH DIFFERENTIAL/PLATELET
BASOS PCT: 0.6 % (ref 0.0–3.0)
Basophils Absolute: 0 10*3/uL (ref 0.0–0.1)
EOS PCT: 2.1 % (ref 0.0–5.0)
Eosinophils Absolute: 0.1 10*3/uL (ref 0.0–0.7)
HEMATOCRIT: 41.6 % (ref 36.0–46.0)
HEMOGLOBIN: 14.1 g/dL (ref 12.0–15.0)
LYMPHS PCT: 39.2 % (ref 12.0–46.0)
Lymphs Abs: 1.9 10*3/uL (ref 0.7–4.0)
MCHC: 34 g/dL (ref 30.0–36.0)
MCV: 93.6 fl (ref 78.0–100.0)
MONOS PCT: 7.9 % (ref 3.0–12.0)
Monocytes Absolute: 0.4 10*3/uL (ref 0.1–1.0)
Neutro Abs: 2.4 10*3/uL (ref 1.4–7.7)
Neutrophils Relative %: 50.2 % (ref 43.0–77.0)
Platelets: 279 10*3/uL (ref 150.0–400.0)
RBC: 4.44 Mil/uL (ref 3.87–5.11)
RDW: 13.2 % (ref 11.5–15.5)
WBC: 4.8 10*3/uL (ref 4.0–10.5)

## 2015-06-11 LAB — BASIC METABOLIC PANEL
BUN: 13 mg/dL (ref 6–23)
CHLORIDE: 106 meq/L (ref 96–112)
CO2: 28 mEq/L (ref 19–32)
Calcium: 10 mg/dL (ref 8.4–10.5)
Creatinine, Ser: 0.91 mg/dL (ref 0.40–1.20)
GFR: 65.26 mL/min (ref >60.00)
Glucose, Bld: 110 mg/dL — ABNORMAL HIGH (ref 70–99)
POTASSIUM: 5.7 meq/L — AB (ref 3.5–5.1)
Sodium: 143 mEq/L (ref 135–145)

## 2015-06-11 LAB — HEPATIC FUNCTION PANEL
ALT: 10 U/L (ref 0–35)
AST: 16 U/L (ref 0–37)
Albumin: 4.4 g/dL (ref 3.5–5.2)
Alkaline Phosphatase: 62 U/L (ref 39–117)
Bilirubin, Direct: 0.1 mg/dL (ref 0.0–0.3)
TOTAL PROTEIN: 6.9 g/dL (ref 6.0–8.3)
Total Bilirubin: 0.5 mg/dL (ref 0.2–1.2)

## 2015-06-11 LAB — LIPID PANEL
CHOLESTEROL: 278 mg/dL — AB (ref 0–200)
HDL: 76.1 mg/dL (ref >39.00)
LDL Cholesterol: 183 mg/dL — ABNORMAL HIGH (ref 0–99)
NonHDL: 201.63
TRIGLYCERIDES: 91 mg/dL (ref 0.0–149.0)
Total CHOL/HDL Ratio: 4
VLDL: 18.2 mg/dL (ref 0.0–40.0)

## 2015-06-11 LAB — TSH: TSH: 4.47 u[IU]/mL (ref 0.35–4.50)

## 2015-06-18 ENCOUNTER — Ambulatory Visit (INDEPENDENT_AMBULATORY_CARE_PROVIDER_SITE_OTHER): Payer: Medicare Other | Admitting: Internal Medicine

## 2015-06-18 ENCOUNTER — Telehealth: Payer: Self-pay | Admitting: Internal Medicine

## 2015-06-18 ENCOUNTER — Encounter: Payer: Self-pay | Admitting: Internal Medicine

## 2015-06-18 VITALS — BP 148/94 | Temp 98.1°F | Ht 66.0 in | Wt 186.3 lb

## 2015-06-18 DIAGNOSIS — M8580 Other specified disorders of bone density and structure, unspecified site: Secondary | ICD-10-CM | POA: Diagnosis not present

## 2015-06-18 DIAGNOSIS — E2839 Other primary ovarian failure: Secondary | ICD-10-CM | POA: Diagnosis not present

## 2015-06-18 DIAGNOSIS — E785 Hyperlipidemia, unspecified: Secondary | ICD-10-CM

## 2015-06-18 DIAGNOSIS — Z0000 Encounter for general adult medical examination without abnormal findings: Secondary | ICD-10-CM | POA: Diagnosis not present

## 2015-06-18 DIAGNOSIS — IMO0001 Reserved for inherently not codable concepts without codable children: Secondary | ICD-10-CM

## 2015-06-18 DIAGNOSIS — Z1211 Encounter for screening for malignant neoplasm of colon: Secondary | ICD-10-CM

## 2015-06-18 DIAGNOSIS — R030 Elevated blood-pressure reading, without diagnosis of hypertension: Secondary | ICD-10-CM

## 2015-06-18 DIAGNOSIS — R7301 Impaired fasting glucose: Secondary | ICD-10-CM | POA: Diagnosis not present

## 2015-06-18 NOTE — Progress Notes (Signed)
Pre visit review using our clinic review tool, if applicable. No additional management support is needed unless otherwise documented below in the visit note.  Chief Complaint  Patient presents with  . Medicare Wellness    HPI: Susan Taylor 70 y.o. comes in today for Preventive Medicare wellness visit . Last visit with me was 2014  ggenerally well  No injury  See below   Health Maintenance  Topic Date Due  . ZOSTAVAX  01/13/2007  . DEXA SCAN  01/13/2012  . COLONOSCOPY  11/05/2013  . MAMMOGRAM  11/17/2014  . TETANUS/TDAP  06/17/2016 (Originally 01/12/1965)  . Hepatitis C Screening  06/17/2016 (Originally 01/27/1946)  . INFLUENZA VACCINE  01/06/2016   Health Maintenance Review LIFESTYLE:  TAD not ex smoker ocass tob  Sugar beverages: no Sleep: 9 hours  Exercises reg walks hour at a time    MEDICARE DOCUMENT QUESTIONS  TO SCAN     Hearing: okVision:  No limitations at present . Last eye check UTD  Safety:  Has smoke detector and wears seat belts.  No firearms. No excess sun exposure. Sees dentist regularly.  Falls: n  Advance directive :  Reviewed  Doesn't have one yet aware   Memory: Felt to be good  , no concern from her or her family.  Depression: No anhedonia unusual crying or depressive symptoms  Nutrition: Eats well balanced diet; adequate calcium and vitamin D. No swallowing chewing problems.  Injury: no major injuries in the last six months.  Other healthcare providers:  Reviewed today .  Social:  Lives with spouse married.   Preventive parameters: up-to-date  Reviewed   ADLS:   There are no problems or need for assistance  driving, feeding, obtaining food, dressing, toileting and bathing, managing money using phone. She is independent.   ROS:  When at [place] had sob going up lots of stepsposs altitude issues  None at reg altitude no cp  GEN/ HEENT: No fever, significant weight changes sweats headaches vision problems hearing changes, CV/  PULM; No chest pain shortness of breath cough, syncope,edema  change in exercise tolerance. GI /GU: No adominal pain, vomiting, change in bowel habits. No blood in the stool. No significant GU symptoms. SKIN/HEME: ,no acute skin rashes suspicious lesions or bleeding. No lymphadenopathy, nodules, masses.  NEURO/ PSYCH:  No neurologic signs such as weakness numbness. No depression anxiety. IMM/ Allergy: No unusual infections.  Allergy .   REST of 12 system review negative except as per HPI   Past Medical History  Diagnosis Date  . Hx of varicella   . Vitamin D deficiency     Family History  Problem Relation Age of Onset  . Arthritis Mother   . Hearing loss Mother   . Heart disease Mother   . Hyperlipidemia Mother   . Hypertension Mother   . Miscarriages / [place] Mother   . Stroke Mother   . Alcohol abuse Father     Social History   Social History  . Marital Status: Married    Spouse Name: N/A  . Number of Children: N/A  . Years of Education: N/A   Social History Main Topics  . Smoking status: Never Smoker   . Smokeless tobacco: Never Used  . Alcohol Use: Yes     Comment: occ  . Drug Use: No  . Sexual Activity:    Partners: Male    Birth Control/ Protection: Post-menopausal   Other Topics Concern  . None   Social History  Narrative   hh of 3  Husband and 30 yo mom with vasc dementia  But healthy    Married    Retired  Pharmacist, [employer]   bs in Agricultural consultant   No pets   Walks exercises 3 x per week. 3 miles    G2P2   7-8 hours sleep   Social etoh 1-4 per week              Outpatient Encounter Prescriptions as of 06/18/2015  Medication Sig  . aspirin 81 MG tablet Take 81 mg by mouth daily.  . Calcium Carbonate (CALCIUM 500 PO) Take 1 tablet by mouth daily.  . Cholecalciferol (VITAMIN D PO) Take 1 tablet by mouth daily. Take '2000mg'$  daily.  . [DISCONTINUED] neomycin-polymyxin b-dexamethasone (MAXITROL) 3.5-10000-0.1 SUSP Place 3 drops into the left eye  every 4 (four) hours as needed.  . [DISCONTINUED] neomycin-polymyxin-hydrocortisone (CORTISPORIN) 3.5-10000-1 ophthalmic suspension Place 3 drops into the left eye every 4 (four) hours.   No facility-administered encounter medications on file as of 06/18/2015.    EXAM:  BP 148/94 mmHg  Temp(Src) 98.1 F (36.7 C) (Oral)  Ht '5\' 6"'$  (1.676 m)  Wt 186 lb 4.8 oz (84.505 kg)  BMI 30.08 kg/m2  Body mass index is 30.08 kg/(m^2).  Physical Exam: Vital signs reviewed JOA:CZYS is a well-developed well-nourished alert cooperative   who appears stated age in no acute distress.  HEENT: normocephalic atraumatic , Eyes: PERRL EOM's full, conjunctiva clear, Nares: paten,t no deformity discharge or tenderness., Ears: no deformity EAC's clear TMs with normal landmarks. Mouth: clear OP, no lesions, edema.  Moist mucous membranes. Dentition in adequate repair. NECK: supple without masses, thyromegaly or bruits. CHEST/PULM:  Clear to auscultation and percussion breath sounds equal no wheeze , rales or rhonchi. No chest wall deformities or tenderness.Breast: normal by inspection . No dimpling, discharge, masses, tenderness or discharge . CV: PMI is nondisplaced, S1 S2 no gallops, murmurs, rubs. Peripheral pulses are full without delay.No JVD .  ABDOMEN: Bowel sounds normal nontender  No guard or rebound, no hepato splenomegal no CVA tenderness.   Extremtities:  No clubbing cyanosis or edema, no acute joint swelling or redness no focal atrophy NEURO:  Oriented x3, cranial nerves 3-12 appear to be intact, no obvious focal weakness,gait within normal limits no abnormal reflexes or asymmetrical SKIN: No acute rashes normal turgor, color, no bruising or petechiae. PSYCH: Oriented, good eye contact, no obvious depression anxiety, cognition and judgment appear normal. LN: no cervical axillary inguinal adenopathy No noted deficits in memory, attention, and speech.   Lab Results  Component Value Date   WBC 4.8  06/11/2015   HGB 14.1 06/11/2015   HCT 41.6 06/11/2015   PLT 279.0 06/11/2015   GLUCOSE 110* 06/11/2015   CHOL 278* 06/11/2015   TRIG 91.0 06/11/2015   HDL 76.10 06/11/2015   LDLDIRECT 143.2 11/16/2012   LDLCALC 183* 06/11/2015   ALT 10 06/11/2015   AST 16 06/11/2015   NA 143 06/11/2015   K 5.7* 06/11/2015   CL 106 06/11/2015   CREATININE 0.91 06/11/2015   BUN 13 06/11/2015   CO2 28 06/11/2015   TSH 4.47 06/11/2015    ASSESSMENT AND PLAN:  Discussed the following assessment and plan:  Visit for preventive health examination - ho on advanced directibe  Medicare annual wellness visit, subsequent  HLD (hyperlipidemia) - last check was very good high hdl now ldl 180 range lsi and repeat in 4 months at [facility]   Fasting hyperglycemia -  neg fam hx exercises will work on diet changes  Estrogen deficiency - Plan: DG Bone Density  Osteopenia - reported in past  on ca vit d  no fracture - Plan: DG Bone Density  Colon cancer screening - due fo coloscopy neg fam hx ifob this year until gets  - Plan: Fecal occult blood, imunochemical  Elevated BP - family hx  ht has been ok ? get home readings adn fu with monitor check  Colon cnacer screening  mammo to make appt  dexa ... Osteopenia .   No fracture    Get updated dexa .   To check w insurance about zostavax decline hep c screen Patient Care Team: Burnis Medin, MD as PCP - General (Internal Medicine) Megan Salon, MD as Attending Physician (Obstetrics and Gynecology) Crista Luria, MD as Attending Physician (Dermatology)  Patient Instructions  Schedule dexa bone desnity scan  Get stool tests  For colon cancer screening  choesterol and blood sugar is up . Avoid animal fats , trans fats simple sugars sweets  and carbohydrates. Processed foods   Mediterranean type diet is helpful to prevent diabetes. Take blood pressure readings twice a day for 7- 10 days and then periodically .To ensure below 140/90   . Bring in readings and  monitor to your next visit  . Plan  Hg a1c and fasting lipids and  Blood sugar in 3-4 months  Before OV  To see if improved.      Healthy lifestyle includes : At least 150 minutes of exercise weeks  , weight at healthy levels, which is usually   BMI 19-25. Avoid trans fats and processed foods;  Increase fresh fruits and veges to 5 servings per day. And avoid sweet beverages including tea and juice. Mediterranean diet with olive oil and nuts have been noted to be heart and brain healthy . Avoid tobacco products . Limit  alcohol to  7 per week for women and 14 servings for men.  Get adequate sleep . Wear seat belts . Don't text and drive .     Health Maintenance, Female Adopting a healthy lifestyle and getting preventive care can go a long way to promote health and wellness. Talk with your health care provider about what schedule of regular examinations is right for you. This is a good chance for you to check in with your provider about disease prevention and staying healthy. In between checkups, there are plenty of things you can do on your own. Experts have done a lot of research about which lifestyle changes and preventive measures are most likely to keep you healthy. Ask your health care provider for more information. WEIGHT AND DIET  Eat a healthy diet  Be sure to include plenty of vegetables, fruits, low-fat dairy products, and lean protein.  Do not eat a lot of foods high in solid fats, added sugars, or salt.  Get regular exercise. This is one of the most important things you can do for your health.  Most adults should exercise for at least 150 minutes each week. The exercise should increase your heart rate and make you sweat (moderate-intensity exercise).  Most adults should also do strengthening exercises at least twice a week. This is in addition to the moderate-intensity exercise.  Maintain a healthy weight  Body mass index (BMI) is a measurement that can be used to  identify possible weight problems. It estimates body fat based on height and weight. Your health care provider can help determine  your BMI and help you achieve or maintain a healthy weight.  For females 38 years of age and older:   A BMI below 18.5 is considered underweight.  A BMI of 18.5 to 24.9 is normal.  A BMI of 25 to 29.9 is considered overweight.  A BMI of 30 and above is considered obese.  Watch levels of cholesterol and blood lipids  You should start having your blood tested for lipids and cholesterol at 69 years of age, then have this test every 5 years.  You may need to have your cholesterol levels checked more often if:  Your lipid or cholesterol levels are high.  You are older than 69 years of age.  You are at high risk for heart disease.  CANCER SCREENING   Lung Cancer  Lung cancer screening is recommended for adults 2-87 years old who are at high risk for lung cancer because of a history of smoking.  A yearly low-dose CT scan of the lungs is recommended for people who:  Currently smoke.  Have quit within the past 15 years.  Have at least a 30-pack-year history of smoking. A pack year is smoking an average of one pack of cigarettes a day for 1 year.  Yearly screening should continue until it has been 15 years since you quit.  Yearly screening should stop if you develop a health problem that would prevent you from having lung cancer treatment.  Breast Cancer  Practice breast self-awareness. This means understanding how your breasts normally appear and feel.  It also means doing regular breast self-exams. Let your health care provider know about any changes, no matter how small.  If you are in your 20s or 30s, you should have a clinical breast exam (CBE) by a health care provider every 1-3 years as part of a regular health exam.  If you are 45 or older, have a CBE every year. Also consider having a breast X-ray (mammogram) every year.  If you have a  family history of breast cancer, talk to your health care provider about genetic screening.  If you are at high risk for breast cancer, talk to your health care provider about having an MRI and a mammogram every year.  Breast cancer gene (BRCA) assessment is recommended for women who have family members with BRCA-related cancers. BRCA-related cancers include:  Breast.  Ovarian.  Tubal.  Peritoneal cancers.  Results of the assessment will determine the need for genetic counseling and BRCA1 and BRCA2 testing. Cervical Cancer Your health care provider may recommend that you be screened regularly for cancer of the pelvic organs (ovaries, uterus, and vagina). This screening involves a pelvic examination, including checking for microscopic changes to the surface of your cervix (Pap test). You may be encouraged to have this screening done every 3 years, beginning at age 67.  For women ages 92-65, health care providers may recommend pelvic exams and Pap testing every 3 years, or they may recommend the Pap and pelvic exam, combined with testing for human papilloma virus (HPV), every 5 years. Some types of HPV increase your risk of cervical cancer. Testing for HPV may also be done on women of any age with unclear Pap test results.  Other health care providers may not recommend any screening for nonpregnant women who are considered low risk for pelvic cancer and who do not have symptoms. Ask your health care provider if a screening pelvic exam is right for you.  If you have had past treatment for  cervical cancer or a condition that could lead to cancer, you need Pap tests and screening for cancer for at least 20 years after your treatment. If Pap tests have been discontinued, your risk factors (such as having a new sexual partner) need to be reassessed to determine if screening should resume. Some women have medical problems that increase the chance of getting cervical cancer. In these cases, your health  care provider may recommend more frequent screening and Pap tests. Colorectal Cancer  This type of cancer can be detected and often prevented.  Routine colorectal cancer screening usually begins at 69 years of age and continues through 69 years of age.  Your health care provider may recommend screening at an earlier age if you have risk factors for colon cancer.  Your health care provider may also recommend using home test kits to check for hidden blood in the stool.  A small camera at the end of a tube can be used to examine your colon directly (sigmoidoscopy or colonoscopy). This is done to check for the earliest forms of colorectal cancer.  Routine screening usually begins at age 48.  Direct examination of the colon should be repeated every 5-10 years through 69 years of age. However, you may need to be screened more often if early forms of precancerous polyps or small growths are found. Skin Cancer  Check your skin from head to toe regularly.  Tell your health care provider about any new moles or changes in moles, especially if there is a change in a mole's shape or color.  Also tell your health care provider if you have a mole that is larger than the size of a pencil eraser.  Always use sunscreen. Apply sunscreen liberally and repeatedly throughout the day.  Protect yourself by wearing long sleeves, pants, a wide-brimmed hat, and sunglasses whenever you are outside. HEART DISEASE, DIABETES, AND HIGH BLOOD PRESSURE   High blood pressure causes heart disease and increases the risk of stroke. High blood pressure is more likely to develop in:  People who have blood pressure in the high end of the normal range (130-139/85-89 mm Hg).  People who are overweight or obese.  People who are African American.  If you are 15-78 years of age, have your blood pressure checked every 3-5 years. If you are 8 years of age or older, have your blood pressure checked every year. You should have  your blood pressure measured twice--once when you are at a hospital or clinic, and once when you are not at a hospital or clinic. Record the average of the two measurements. To check your blood pressure when you are not at a hospital or clinic, you can use:  An automated blood pressure machine at a pharmacy.  A home blood pressure monitor.  If you are between 81 years and 48 years old, ask your health care provider if you should take aspirin to prevent strokes.  Have regular diabetes screenings. This involves taking a blood sample to check your fasting blood sugar level.  If you are at a normal weight and have a low risk for diabetes, have this test once every three years after 69 years of age.  If you are overweight and have a high risk for diabetes, consider being tested at a younger age or more often. PREVENTING INFECTION  Hepatitis B  If you have a higher risk for hepatitis B, you should be screened for this virus. You are considered at high risk for hepatitis B  if:  You were born in a country where hepatitis B is common. Ask your health care provider which countries are considered high risk.  Your parents were born in a high-risk country, and you have not been immunized against hepatitis B (hepatitis B vaccine).  You have HIV or AIDS.  You use needles to inject street drugs.  You live with someone who has hepatitis B.  You have had sex with someone who has hepatitis B.  You get hemodialysis treatment.  You take certain medicines for conditions, including cancer, organ transplantation, and autoimmune conditions. Hepatitis C  Blood testing is recommended for:  Everyone born from [birth year] through 1964.  Anyone with known risk factors for hepatitis C. Sexually transmitted infections (STIs)  You should be screened for sexually transmitted infections (STIs) including gonorrhea and chlamydia if:  You are sexually active and are younger than 69 years of age.  You are older than  69 years of age and your health care provider tells you that you are at risk for this type of infection.  Your sexual activity has changed since you were last screened and you are at an increased risk for chlamydia or gonorrhea. Ask your health care provider if you are at risk.  If you do not have HIV, but are at risk, it may be recommended that you take a prescription medicine daily to prevent HIV infection. This is called pre-exposure prophylaxis (PrEP). You are considered at risk if:  You are sexually active and do not regularly use condoms or know the HIV status of your partner(s).  You take drugs by injection.  You are sexually active with a partner who has HIV. Talk with your health care provider about whether you are at high risk of being infected with HIV. If you choose to begin PrEP, you should first be tested for HIV. You should then be tested every 3 months for as long as you are taking PrEP.  PREGNANCY   If you are premenopausal and you may become pregnant, ask your health care provider about preconception counseling.  If you may become pregnant, take 400 to 800 micrograms (mcg) of folic acid every day.  If you want to prevent pregnancy, talk to your health care provider about birth control (contraception). OSTEOPOROSIS AND MENOPAUSE   Osteoporosis is a disease in which the bones lose minerals and strength with aging. This can result in serious bone fractures. Your risk for osteoporosis can be identified using a bone density scan.  If you are 60 years of age or older, or if you are at risk for osteoporosis and fractures, ask your health care provider if you should be screened.  Ask your health care provider whether you should take a calcium or vitamin D supplement to lower your risk for osteoporosis.  Menopause may have certain physical symptoms and risks.  Hormone replacement therapy may reduce some of these symptoms and risks. Talk to your health care provider about  whether hormone replacement therapy is right for you.  HOME CARE INSTRUCTIONS   Schedule regular health, dental, and eye exams.  Stay current with your immunizations.   Do not use any tobacco products including cigarettes, chewing tobacco, or electronic cigarettes.  If you are pregnant, do not drink alcohol.  If you are breastfeeding, limit how much and how often you drink alcohol.  Limit alcohol intake to no more than 1 drink per day for nonpregnant women. One drink equals 12 ounces of beer, 5 ounces of wine, or  1 ounces of hard liquor.  Do not use street drugs.  Do not share needles.  Ask your health care provider for help if you need support or information about quitting drugs.  Tell your health care provider if you often feel depressed.  Tell your health care provider if you have ever been abused or do not feel safe at home.   This information is not intended to replace advice given to you by your health care provider. Make sure you discuss any questions you have with your health care provider.   Document Released: 12/07/2010 Document Revised: 06/14/2014 Document Reviewed: 04/25/2013 Elsevier Interactive Patient Education 2016 [place]. Charron Coultas M.D.

## 2015-06-18 NOTE — Telephone Encounter (Signed)
Pt would like to have bone density test at [facility].

## 2015-06-18 NOTE — Patient Instructions (Addendum)
Schedule dexa bone desnity scan  Get stool tests  For colon cancer screening  choesterol and blood sugar is up . Avoid animal fats , trans fats simple sugars sweets  and carbohydrates. Processed foods   Mediterranean type diet is helpful to prevent diabetes. Take blood pressure readings twice a day for 7- 10 days and then periodically .To ensure below 140/90   . Bring in readings and monitor to your next visit  . Plan  Hg a1c and fasting lipids and  Blood sugar in 3-4 months  Before OV  To see if improved.      Healthy lifestyle includes : At least 150 minutes of exercise weeks  , weight at healthy levels, which is usually   BMI 19-25. Avoid trans fats and processed foods;  Increase fresh fruits and veges to 5 servings per day. And avoid sweet beverages including tea and juice. Mediterranean diet with olive oil and nuts have been noted to be heart and brain healthy . Avoid tobacco products . Limit  alcohol to  7 per week for women and 14 servings for men.  Get adequate sleep . Wear seat belts . Don't text and drive .     Health Maintenance, Female Adopting a healthy lifestyle and getting preventive care can go a long way to promote health and wellness. Talk with your health care provider about what schedule of regular examinations is right for you. This is a good chance for you to check in with your provider about disease prevention and staying healthy. In between checkups, there are plenty of things you can do on your own. Experts have done a lot of research about which lifestyle changes and preventive measures are most likely to keep you healthy. Ask your health care provider for more information. WEIGHT AND DIET  Eat a healthy diet  Be sure to include plenty of vegetables, fruits, low-fat dairy products, and lean protein.  Do not eat a lot of foods high in solid fats, added sugars, or salt.  Get regular exercise. This is one of the most important things you can do for your  health.  Most adults should exercise for at least 150 minutes each week. The exercise should increase your heart rate and make you sweat (moderate-intensity exercise).  Most adults should also do strengthening exercises at least twice a week. This is in addition to the moderate-intensity exercise.  Maintain a healthy weight  Body mass index (BMI) is a measurement that can be used to identify possible weight problems. It estimates body fat based on height and weight. Your health care provider can help determine your BMI and help you achieve or maintain a healthy weight.  For females 49 years of age and older:   A BMI below 18.5 is considered underweight.  A BMI of 18.5 to 24.9 is normal.  A BMI of 25 to 29.9 is considered overweight.  A BMI of 30 and above is considered obese.  Watch levels of cholesterol and blood lipids  You should start having your blood tested for lipids and cholesterol at 69 years of age, then have this test every 5 years.  You may need to have your cholesterol levels checked more often if:  Your lipid or cholesterol levels are high.  You are older than 69 years of age.  You are at high risk for heart disease.  CANCER SCREENING   Lung Cancer  Lung cancer screening is recommended for adults 89-62 years old who are at high  risk for lung cancer because of a history of smoking.  A yearly low-dose CT scan of the lungs is recommended for people who:  Currently smoke.  Have quit within the past 15 years.  Have at least a 30-pack-year history of smoking. A pack year is smoking an average of one pack of cigarettes a day for 1 year.  Yearly screening should continue until it has been 15 years since you quit.  Yearly screening should stop if you develop a health problem that would prevent you from having lung cancer treatment.  Breast Cancer  Practice breast self-awareness. This means understanding how your breasts normally appear and feel.  It also  means doing regular breast self-exams. Let your health care provider know about any changes, no matter how small.  If you are in your 20s or 30s, you should have a clinical breast exam (CBE) by a health care provider every 1-3 years as part of a regular health exam.  If you are 51 or older, have a CBE every year. Also consider having a breast X-ray (mammogram) every year.  If you have a family history of breast cancer, talk to your health care provider about genetic screening.  If you are at high risk for breast cancer, talk to your health care provider about having an MRI and a mammogram every year.  Breast cancer gene (BRCA) assessment is recommended for women who have family members with BRCA-related cancers. BRCA-related cancers include:  Breast.  Ovarian.  Tubal.  Peritoneal cancers.  Results of the assessment will determine the need for genetic counseling and BRCA1 and BRCA2 testing. Cervical Cancer Your health care provider may recommend that you be screened regularly for cancer of the pelvic organs (ovaries, uterus, and vagina). This screening involves a pelvic examination, including checking for microscopic changes to the surface of your cervix (Pap test). You may be encouraged to have this screening done every 3 years, beginning at age 19.  For women ages 1-65, health care providers may recommend pelvic exams and Pap testing every 3 years, or they may recommend the Pap and pelvic exam, combined with testing for human papilloma virus (HPV), every 5 years. Some types of HPV increase your risk of cervical cancer. Testing for HPV may also be done on women of any age with unclear Pap test results.  Other health care providers may not recommend any screening for nonpregnant women who are considered low risk for pelvic cancer and who do not have symptoms. Ask your health care provider if a screening pelvic exam is right for you.  If you have had past treatment for cervical cancer or a  condition that could lead to cancer, you need Pap tests and screening for cancer for at least 20 years after your treatment. If Pap tests have been discontinued, your risk factors (such as having a new sexual partner) need to be reassessed to determine if screening should resume. Some women have medical problems that increase the chance of getting cervical cancer. In these cases, your health care provider may recommend more frequent screening and Pap tests. Colorectal Cancer  This type of cancer can be detected and often prevented.  Routine colorectal cancer screening usually begins at 70 years of age and continues through 69 years of age.  Your health care provider may recommend screening at an earlier age if you have risk factors for colon cancer.  Your health care provider may also recommend using home test kits to check for hidden blood in  the stool.  A small camera at the end of a tube can be used to examine your colon directly (sigmoidoscopy or colonoscopy). This is done to check for the earliest forms of colorectal cancer.  Routine screening usually begins at age 43.  Direct examination of the colon should be repeated every 5-10 years through 69 years of age. However, you may need to be screened more often if early forms of precancerous polyps or small growths are found. Skin Cancer  Check your skin from head to toe regularly.  Tell your health care provider about any new moles or changes in moles, especially if there is a change in a mole's shape or color.  Also tell your health care provider if you have a mole that is larger than the size of a pencil eraser.  Always use sunscreen. Apply sunscreen liberally and repeatedly throughout the day.  Protect yourself by wearing long sleeves, pants, a wide-brimmed hat, and sunglasses whenever you are outside. HEART DISEASE, DIABETES, AND HIGH BLOOD PRESSURE   High blood pressure causes heart disease and increases the risk of stroke. High  blood pressure is more likely to develop in:  People who have blood pressure in the high end of the normal range (130-139/85-89 mm Hg).  People who are overweight or obese.  People who are African American.  If you are 71-53 years of age, have your blood pressure checked every 3-5 years. If you are 50 years of age or older, have your blood pressure checked every year. You should have your blood pressure measured twice--once when you are at a hospital or clinic, and once when you are not at a hospital or clinic. Record the average of the two measurements. To check your blood pressure when you are not at a hospital or clinic, you can use:  An automated blood pressure machine at a pharmacy.  A home blood pressure monitor.  If you are between 4 years and 31 years old, ask your health care provider if you should take aspirin to prevent strokes.  Have regular diabetes screenings. This involves taking a blood sample to check your fasting blood sugar level.  If you are at a normal weight and have a low risk for diabetes, have this test once every three years after 69 years of age.  If you are overweight and have a high risk for diabetes, consider being tested at a younger age or more often. PREVENTING INFECTION  Hepatitis B  If you have a higher risk for hepatitis B, you should be screened for this virus. You are considered at high risk for hepatitis B if:  You were born in a country where hepatitis B is common. Ask your health care provider which countries are considered high risk.  Your parents were born in a high-risk country, and you have not been immunized against hepatitis B (hepatitis B vaccine).  You have HIV or AIDS.  You use needles to inject street drugs.  You live with someone who has hepatitis B.  You have had sex with someone who has hepatitis B.  You get hemodialysis treatment.  You take certain medicines for conditions, including cancer, organ transplantation, and  autoimmune conditions. Hepatitis C  Blood testing is recommended for:  Everyone born from [birth year] through 1964.  Anyone with known risk factors for hepatitis C. Sexually transmitted infections (STIs)  You should be screened for sexually transmitted infections (STIs) including gonorrhea and chlamydia if:  You are sexually active and are younger than  69 years of age.  You are older than 69 years of age and your health care provider tells you that you are at risk for this type of infection.  Your sexual activity has changed since you were last screened and you are at an increased risk for chlamydia or gonorrhea. Ask your health care provider if you are at risk.  If you do not have HIV, but are at risk, it may be recommended that you take a prescription medicine daily to prevent HIV infection. This is called pre-exposure prophylaxis (PrEP). You are considered at risk if:  You are sexually active and do not regularly use condoms or know the HIV status of your partner(s).  You take drugs by injection.  You are sexually active with a partner who has HIV. Talk with your health care provider about whether you are at high risk of being infected with HIV. If you choose to begin PrEP, you should first be tested for HIV. You should then be tested every 3 months for as long as you are taking PrEP.  PREGNANCY   If you are premenopausal and you may become pregnant, ask your health care provider about preconception counseling.  If you may become pregnant, take 400 to 800 micrograms (mcg) of folic acid every day.  If you want to prevent pregnancy, talk to your health care provider about birth control (contraception). OSTEOPOROSIS AND MENOPAUSE   Osteoporosis is a disease in which the bones lose minerals and strength with aging. This can result in serious bone fractures. Your risk for osteoporosis can be identified using a bone density scan.  If you are 2 years of age or older, or if you are at risk  for osteoporosis and fractures, ask your health care provider if you should be screened.  Ask your health care provider whether you should take a calcium or vitamin D supplement to lower your risk for osteoporosis.  Menopause may have certain physical symptoms and risks.  Hormone replacement therapy may reduce some of these symptoms and risks. Talk to your health care provider about whether hormone replacement therapy is right for you.  HOME CARE INSTRUCTIONS   Schedule regular health, dental, and eye exams.  Stay current with your immunizations.   Do not use any tobacco products including cigarettes, chewing tobacco, or electronic cigarettes.  If you are pregnant, do not drink alcohol.  If you are breastfeeding, limit how much and how often you drink alcohol.  Limit alcohol intake to no more than 1 drink per day for nonpregnant women. One drink equals 12 ounces of beer, 5 ounces of wine, or 1 ounces of hard liquor.  Do not use street drugs.  Do not share needles.  Ask your health care provider for help if you need support or information about quitting drugs.  Tell your health care provider if you often feel depressed.  Tell your health care provider if you have ever been abused or do not feel safe at home.   This information is not intended to replace advice given to you by your health care provider. Make sure you discuss any questions you have with your health care provider.   Document Released: 12/07/2010 Document Revised: 06/14/2014 Document Reviewed: 04/25/2013 Elsevier Interactive Patient Education [employer].

## 2015-06-19 NOTE — Telephone Encounter (Signed)
Order faxed to [facility]. 

## 2015-07-11 LAB — HM MAMMOGRAPHY

## 2015-07-16 ENCOUNTER — Encounter: Payer: Self-pay | Admitting: Family Medicine

## 2015-10-09 ENCOUNTER — Other Ambulatory Visit: Payer: Medicare Other

## 2015-10-16 ENCOUNTER — Ambulatory Visit: Payer: Medicare Other | Admitting: Internal Medicine

## 2015-10-31 ENCOUNTER — Encounter: Payer: Self-pay | Admitting: Internal Medicine

## 2017-08-17 ENCOUNTER — Emergency Department (HOSPITAL_BASED_OUTPATIENT_CLINIC_OR_DEPARTMENT_OTHER): Payer: Medicare Other

## 2017-08-17 ENCOUNTER — Encounter (HOSPITAL_BASED_OUTPATIENT_CLINIC_OR_DEPARTMENT_OTHER): Payer: Self-pay

## 2017-08-17 ENCOUNTER — Other Ambulatory Visit: Payer: Self-pay

## 2017-08-17 ENCOUNTER — Emergency Department (HOSPITAL_BASED_OUTPATIENT_CLINIC_OR_DEPARTMENT_OTHER)
Admission: EM | Admit: 2017-08-17 | Discharge: 2017-08-17 | Disposition: A | Discharge status: Home / Self Care | Payer: Medicare Other | Attending: Emergency Medicine | Admitting: Emergency Medicine

## 2017-08-17 DIAGNOSIS — I8002 Phlebitis and thrombophlebitis of superficial vessels of left lower extremity: Secondary | ICD-10-CM | POA: Diagnosis not present

## 2017-08-17 DIAGNOSIS — M79662 Pain in left lower leg: Secondary | ICD-10-CM | POA: Diagnosis present

## 2017-08-17 MED ORDER — RIVAROXABAN 10 MG PO TABS: 10.0000 mg | ORAL_TABLET | Freq: Every day | ORAL | 0 refills | Status: DC

## 2017-08-17 NOTE — ED Notes (Signed)
ED Provider at bedside. 

## 2017-08-17 NOTE — ED Triage Notes (Addendum)
C/o swelling behind left knee that she feels is r/t to a varicose vein x 2 days-denies injury-NAD-steady gait-red streak noted with no break in skin

## 2017-08-17 NOTE — ED Provider Notes (Signed)
[facility]PART[facility] Provider Note   CSN: [id number] Arrival date & time: 08/17/17  1522     History   Chief Complaint Chief Complaint  Patient presents with  . Leg Swelling    HPI Susan Taylor is a 72 y.o. female.  HPI Patient presents with 2 days of left lower extremity swelling, redness, tenderness.  States started as a red streak at her medial left calf and has extended up to her posterior knee.  States she feels "lumps" in the area.  Denies any fever or chills.  She has had no chest pain or shortness of breath.  Patient states that she recently traveled for several hours in a vehicle.  No personal or family history of DVT/PE Past Medical History:  Diagnosis Date  . Hx of varicella   . Vitamin D deficiency     Patient Active Problem List   Diagnosis Date Noted  . Family history of first degree relative with dementia vascular  03/06/2012  . Family history of dementia mom 03/06/2012  . Family history of vascular disease mom  03/06/2012  . Elevated blood pressure 02/14/2012  . Elevated cholesterol 02/14/2012    Past Surgical History:  Procedure Laterality Date  . squamous cell carcinoma removal      OB History    Gravida Para Term Preterm AB Living   2 2           SAB TAB Ectopic Multiple Live Births                   Home Medications    Prior to Admission medications   Medication Sig Start Date End Date Taking? Authorizing Provider  Calcium Carbonate (CALCIUM 500 PO) Take 1 tablet by mouth daily.    [provider]  Cholecalciferol (VITAMIN D PO) Take 1 tablet by mouth daily. Take 2000mg  daily.    [provider]  rivaroxaban (XARELTO) 10 MG TABS tablet Take 1 tablet (10 mg total) by mouth daily. 08/17/17   Julianne Rice, MD    Family History Family History  Problem Relation Age of Onset  . Arthritis Mother   . Hearing loss Mother   . Heart disease Mother   . Hyperlipidemia Mother   . Hypertension Mother    . Miscarriages / [place] Mother   . Stroke Mother   . Alcohol abuse Father     Social History Social History   Tobacco Use  . Smoking status: Never Smoker  . Smokeless tobacco: Never Used  Substance Use Topics  . Alcohol use: Yes    Comment: occ  . Drug use: No     Allergies   Patient has no known allergies.   Review of Systems Review of Systems  Constitutional: Negative for chills, fatigue and fever.  Eyes: Negative for visual disturbance.  Respiratory: Negative for cough and shortness of breath.   Cardiovascular: Negative for chest pain.  Gastrointestinal: Negative for abdominal pain, constipation, diarrhea, nausea and vomiting.  Musculoskeletal: Positive for myalgias. Negative for arthralgias.  Skin: Positive for color change. Negative for wound.  Neurological: Negative for dizziness, weakness, light-headedness, numbness and headaches.  All other systems reviewed and are negative.    Physical Exam Updated Vital Signs BP (!) 166/101 (BP Location: Left Arm)   Pulse 93   Temp 98.4 F (36.9 C) (Oral)   Resp 16   Ht 5\' 10"  (1.778 m)   Wt 83.9 kg (185 lb)   SpO2 99%   BMI  26.54 kg/m   Physical Exam  Constitutional: She is oriented to person, place, and time. She appears well-developed and well-nourished.  HENT:  Head: Normocephalic and atraumatic.  Mouth/Throat: Oropharynx is clear and moist.  Eyes: EOM are normal. Pupils are equal, round, and reactive to light.  Neck: Normal range of motion. Neck supple. No JVD present.  Cardiovascular: Normal rate and regular rhythm. Exam reveals no gallop and no friction rub.  No murmur heard. Pulmonary/Chest: Effort normal and breath sounds normal. No stridor. No respiratory distress. She has no wheezes. She has no rales. She exhibits no tenderness.  Abdominal: Soft. Bowel sounds are normal. There is no tenderness. There is no rebound and no guarding.  Musculoskeletal: Normal range of motion. She exhibits no edema  or tenderness.  Patient patient with red, warm, tender streak from the right medial calf to the posterior medial knee.  Right calf is symmetric with the left.  Distal pulses are 2+.  Full range of motion of right knee and right ankle without discomfort  Neurological: She is alert and oriented to person, place, and time.  Moving all extremities without focal deficit.  Sensation fully intact.  Skin: Skin is warm and dry. Capillary refill takes less than 2 seconds. No rash noted. There is erythema.  Psychiatric: She has a normal mood and affect. Her behavior is normal.  Nursing note and vitals reviewed.    ED Treatments / Results  Labs (all labs ordered are listed, but only abnormal results are displayed) Labs Reviewed - No data to display  EKG  EKG Interpretation None       Radiology US Venous Img Lower Unilateral Left  Result Date: 08/17/2017 CLINICAL DATA:  Left calf swelling EXAM: LEFT LOWER EXTREMITY VENOUS DOPPLER ULTRASOUND TECHNIQUE: Gray-scale sonography with graded compression, as well as color Doppler and duplex ultrasound were performed to evaluate the lower extremity deep venous systems from the level of the common femoral vein and including the common femoral, femoral, profunda femoral, popliteal and calf veins including the posterior tibial, peroneal and gastrocnemius veins when visible. The superficial great saphenous vein was also interrogated. Spectral Doppler was utilized to evaluate flow at rest and with distal augmentation maneuvers in the common femoral, femoral and popliteal veins. COMPARISON:  None. FINDINGS: Contralateral Common Femoral Vein: Respiratory phasicity is normal and symmetric with the symptomatic side. No evidence of thrombus. Normal compressibility. Common Femoral Vein: No evidence of thrombus. Normal compressibility, respiratory phasicity and response to augmentation. Saphenofemoral Junction: No evidence of thrombus. Normal compressibility and flow on  color Doppler imaging. Profunda Femoral Vein: No evidence of thrombus. Normal compressibility and flow on color Doppler imaging. Femoral Vein: No evidence of thrombus. Normal compressibility, respiratory phasicity and response to augmentation. Popliteal Vein: No evidence of thrombus. Normal compressibility, respiratory phasicity and response to augmentation. Calf Veins: No evidence of thrombus. Normal compressibility and flow on color Doppler imaging. Superficial Great Saphenous Vein: Thrombosis noted within the left greater saphenous vein from the lower thigh to the mid calf. Venous Reflux:  None. Other Findings:  None. IMPRESSION: No evidence of deep venous thrombosis. Superficial thrombophlebitis in the left greater saphenous vein from the lower thigh to the mid calf. Electronically Signed   By: Rolm Baptise M.D.   On: 08/17/2017 20:15    Procedures Procedures (including critical care time)  Medications Ordered in ED Medications - No data to display   Initial Impression / Assessment and Plan / ED Course  I have reviewed the triage vital  signs and the nursing notes.  Pertinent labs & imaging results that were available during my care of the patient were reviewed by me and considered in my medical decision making (see chart for details).     Clinical exam consistent with superficial thrombophlebitis.  Will get ultrasound to rule out DVT. Thrombosis in the left greater saphenous vein.  Greater than 5 cm in length.  Patient does have some increased risk for extension to deep vein and thromboembolism because of this.  Discussed with patient conservative management with NSAIDs and compression versus low-dose Xarelto.  No bleeding history.  Is not at increased fall risk.  No history of any kidney disorder.  Agrees to starting the Xarelto and will follow up with her primary physician in 1 week.  She understands the need to return immediately for worsening redness swelling, chest pain or shortness of  breath.  Advised to use warm compressions and keep leg elevated. Final Clinical Impressions(s) / ED Diagnoses   Final diagnoses:  Thrombophlebitis of superficial veins of left lower extremity    ED Discharge Orders        Ordered    rivaroxaban (XARELTO) 10 MG TABS tablet  Daily     08/17/17 2057       Julianne Rice, MD 08/17/17 2100

## 2018-01-25 ENCOUNTER — Ambulatory Visit: Payer: Medicare Other | Attending: Family Medicine | Admitting: Physical Therapy

## 2018-01-25 ENCOUNTER — Other Ambulatory Visit: Payer: Self-pay

## 2018-01-25 ENCOUNTER — Encounter: Payer: Self-pay | Admitting: Physical Therapy

## 2018-01-25 DIAGNOSIS — M79605 Pain in left leg: Secondary | ICD-10-CM

## 2018-01-25 DIAGNOSIS — M6281 Muscle weakness (generalized): Secondary | ICD-10-CM | POA: Diagnosis present

## 2018-01-25 NOTE — Therapy (Signed)
[facility] [address] [place], [place], [address] Phone: [phone]   Fax:  [phone]  Physical Therapy Treatment  Patient Details  Name: Susan Taylor MRN: [id number] Date of Birth: 1945/09/02 Referring Provider: Dineen Kid, MD   Encounter Date: 01/25/2018  PT End of Session - 01/25/18 1021    Visit Number  1    Date for PT Re-Evaluation  03/10/18    Authorization Type  [employer] Medicare    Authorization Time Period  01/25/18 to 03/10/18    PT Start Time  0931    PT Stop Time  1015    PT Time Calculation (min)  44 min    Activity Tolerance  Patient tolerated treatment well;No increased pain    Behavior During Therapy  WFL for tasks assessed/performed       Past Medical History:  Diagnosis Date  . Hx of varicella   . Vitamin D deficiency     Past Surgical History:  Procedure Laterality Date  . squamous cell carcinoma removal      There were no vitals filed for this visit.  Subjective Assessment - 01/25/18 0935    Subjective  Pt reports that she started having Lt buttock pain down the outside of her leg approximately 2 months ago. She thinks that it started with she was outside playing basketball with her grandson. She thinks that the pain has overall improved with pain medication and a steroid. She typically walks for exercise, but feels that she can only walk 1-2 block now at most.     Pertinent History  none significant    Limitations  Standing    Patient Stated Goals  back to walking 3 miles, 4x/week     Currently in Pain?  No/denies    Pain Location  Buttocks    Pain Orientation  Left;Posterior;Lateral    Pain Descriptors / Indicators  Aching    Pain Type  Acute pain    Pain Radiating Towards  down posterior/lateral thigh to lateral knee (tightness along the lateral shin)    Pain Onset  More than a month ago    Pain Frequency  Intermittent    Aggravating Factors   being up on feet/walking     Pain  Relieving Factors  pain medication might be helping     Effect of Pain on Daily Activities  unable to walk 3 miles like she used to          [facility] PT Assessment - 01/25/18 0001      Assessment   Medical Diagnosis  Lt sciatica    Referring Provider  Dineen Kid, MD    Onset Date/Surgical Date  --   ~2 months ago   Next MD Visit  only if needed       Precautions   Precautions  None      Restrictions   Weight Bearing Restrictions  No      Balance Screen   Has the patient fallen in the past 6 months  No    Has the patient had a decrease in activity level because of a fear of falling?   No    Is the patient reluctant to leave their home because of a fear of falling?   No      Home Environment   Living Environment  Private residence    Additional Comments  3 flights of stairs in the home       Prior Function  Level of Independence  Independent    Leisure  4 grandkids       Cognition   Overall Cognitive Status  Within Functional Limits for tasks assessed      Observation/Other Assessments   Focus on Therapeutic Outcomes (FOTO)   33% limited       Sensation   Additional Comments  Pt reports tingling in last 4 toes on Lt foot       ROM / Strength   AROM / PROM / Strength  AROM;Strength      AROM   AROM Assessment Site  Lumbar    Lumbar Flexion  WNL, pain free    Lumbar Extension  WNL, pain free      Strength   Strength Assessment Site  Hip;Knee    Right/Left Hip  Right;Left    Right Hip Flexion  4/5    Right Hip Extension  4/5    Right Hip External Rotation   4/5    Right Hip Internal Rotation  5/5    Right Hip ABduction  4/5    Left Hip Flexion  4/5    Left Hip Extension  4/5    Left Hip External Rotation  4/5    Left Hip Internal Rotation  5/5    Left Hip ABduction  4/5      Flexibility   Soft Tissue Assessment /Muscle Length  yes    Hamstrings  WNL    Quadriceps  WNL      Palpation   Palpation comment  tenderness along Lt gluteals, greater trochanter,  proximal vastus lateralis, proximal fibular head      Special Tests   Other special tests  (-) straight leg raise (+) thomas test Lt and Rt, (+) ober Lt       Transfers   Comments  (+) knee valgus on Lt during BLE squat       Ambulation/Gait   Gait Comments  (+) knee valgus, hip adduction during ambulation      High Level Balance   High Level Balance Comments  SLS: 10 sec on Rt, 3 sec on Lt (+) trendelenburg                   [facility] Adult PT Treatment/Exercise - 01/25/18 0001      Exercises   Exercises  Knee/Hip      Knee/Hip Exercises: Stretches   Piriformis Stretch  2 reps;Left;30 seconds    Piriformis Stretch Limitations  seated              PT Education - 01/25/18 1120    Education Details  eval findings/POC; seated piriformis stretch     Person(s) Educated  Patient    Methods  Explanation    Comprehension  Verbalized understanding       PT Short Term Goals - 01/25/18 1022      PT SHORT TERM GOAL #1   Title  Pt will demo consistency and independence with her HEP to improve LE flexibility and strength.    Time  3    Period  Weeks    Status  New    Target Date  02/15/18        PT Long Term Goals - 01/25/18 1023      PT LONG TERM GOAL #1   Title  Pt will demo improved LE strength to 5/5 MMT which will improve her efficiency with activity around her house.     Time  6  Period  Weeks    Status  New    Target Date  03/10/18      PT LONG TERM GOAL #2   Title  Pt will be able to maintain single leg stance on the Lt atleast 10 sec without trendelenburg or LOB, to reflect improvements in single leg stability and strength.     Time  6    Period  Weeks    Status  New      PT LONG TERM GOAL #3   Title  Pt will report improved activity tolerance evident by her ability to return to walking atleast 2 miles, 4 days a week without increase in buttock pain.     Time  6    Period  Weeks    Status  New      PT LONG TERM GOAL #4   Title  Pt will be  able to complete BLE squat without Lt knee valgus deviation, 2/3 trials, to prevent compensatory patterns during the day while picking up objects from the floor.     Time  6    Period  Weeks    Status  New      PT LONG TERM GOAL #5   Title  Pt will report atleast 75% improvement in her pain from the start of PT which will improve her ability to complete activity with her grandchildren.     Time  6    Period  Weeks    Status  New            Plan - 01/25/18 1219    Clinical Impression Statement  Pt is a pleasant 72 y.o F referred to OPPT with complaints of Lt buttock pain and tightness along the lateral thigh down to the knee. She states she will occasionally have tightness along the Lt lateral shin as well. Overall, her pain is improved since its onset 2 months ago. She demonstrates weakness of B LEs, limited hip flexibility, and is tender along the Lt greater trochanter, gluteals and ITB compared to the Rt side. She typically walks 3 miles several days a week, but this has been limited due to her pain. She would benefit from skilled PT to address her limitations in strength, flexibility, stability and decrease muscle spasm along the hip, to promote return to her regular exercise and participation in activity with her grandkids.     Clinical Presentation  Stable    Clinical Presentation due to:  gradually improved     Clinical Decision Making  Low    Rehab Potential  Good    PT Frequency  2x / week    PT Duration  6 weeks    PT Treatment/Interventions  ADLs/Self Care Home Management;Moist Heat;Cryotherapy;Iontophoresis 4mg /ml Dexamethasone;Therapeutic exercise;Therapeutic activities;Functional mobility training;Stair training;Balance training;Neuromuscular re-education;Patient/family education;Manual techniques;Passive range of motion;Taping;Dry needling    PT Next Visit Plan  discuss dry needling and complete if pt agreeable (glutes); implement HEP for hip strength; progress hip strength     PT Home Exercise Plan  next session    Recommended Other Services  none     Consulted and Agree with Plan of Care  Patient       Patient will benefit from skilled therapeutic intervention in order to improve the following deficits and impairments:  Decreased activity tolerance, Decreased strength, Postural dysfunction, Pain, Improper body mechanics, Decreased range of motion, Decreased balance  Visit Diagnosis: Pain in left leg  Muscle weakness (generalized)     Problem List Patient  Active Problem List   Diagnosis Date Noted  . Family history of first degree relative with dementia vascular  03/06/2012  . Family history of dementia mom 03/06/2012  . Family history of vascular disease mom  03/06/2012  . Elevated blood pressure 02/14/2012  . Elevated cholesterol 02/14/2012    1:19 PM,01/25/18 Sherol Dade PT, DPT [place] [facility] [place]  [facility] [address], [place], [place], [address] Phone: [phone]   Fax:  [phone]  Name: BREHANNA DEVENY MRN: [id number] Date of Birth: [birth year]

## 2018-01-31 ENCOUNTER — Ambulatory Visit: Payer: Medicare Other

## 2018-01-31 DIAGNOSIS — M6281 Muscle weakness (generalized): Secondary | ICD-10-CM

## 2018-01-31 DIAGNOSIS — M79605 Pain in left leg: Secondary | ICD-10-CM | POA: Diagnosis not present

## 2018-01-31 NOTE — Patient Instructions (Addendum)
Trigger Point Dry Needling  . What is Trigger Point Dry Needling (DN)? o DN is a physical therapy technique used to treat muscle pain and dysfunction. Specifically, DN helps deactivate muscle trigger points (muscle knots).  o A thin filiform needle is used to penetrate the skin and stimulate the underlying trigger point. The goal is for a local twitch response (LTR) to occur and for the trigger point to relax. No medication of any kind is injected during the procedure.   . What Does Trigger Point Dry Needling Feel Like?  o The procedure feels different for each individual patient. Some patients report that they do not actually feel the needle enter the skin and overall the process is not painful. Very mild bleeding may occur. However, many patients feel a deep cramping in the muscle in which the needle was inserted. This is the local twitch response.   Marland Kitchen How Will I feel after the treatment? o Soreness is normal, and the onset of soreness may not occur for a few hours. Typically this soreness does not last longer than two days.  o Bruising is uncommon, however; ice can be used to decrease any possible bruising.  o In rare cases feeling tired or nauseous after the treatment is normal. In addition, your symptoms may get worse before they get better, this period will typically not last longer than 24 hours.   . What Can I do After My Treatment? o Increase your hydration by drinking more water for the next 24 hours. o You may place ice or heat on the areas treated that have become sore, however, do not use heat on inflamed or bruised areas. Heat often brings more relief post needling. o You can continue your regular activities, but vigorous activity is not recommended initially after the treatment for 24 hours. o DN is best combined with other physical therapy such as strengthening, stretching, and other therapies.    Access Code: [email or web address]  URL: [email or web address]  Date: 01/31/2018   Prepared by: Sigurd Sos   Exercises  Seated Figure 4 Piriformis Stretch - 3 reps - 1 sets - 20 hold - 3x daily - 7x weekly  Seated Hamstring Stretch - 3 reps - 1 sets - 20 hold - 3x daily - 7x weekly    [facility] [address] [place], [place] [address] Phone # [phone] Fax [phone]

## 2018-01-31 NOTE — Therapy (Signed)
[facility] [address] [place], [place], [address] Phone: [phone]   Fax:  [phone]  Physical Therapy Treatment  Patient Details  Name: Susan Taylor MRN: [id number] Date of Birth: [birth year] Referring Provider: Dineen Kid, MD   Encounter Date: 01/31/2018  PT End of Session - 01/31/18 1013    Visit Number  2    Date for PT Re-Evaluation  03/10/18    Authorization Type  [employer]HC Medicare    Authorization Time Period  01/25/18 to 03/10/18    PT Start Time  0932    PT Stop Time  1024    PT Time Calculation (min)  52 min    Activity Tolerance  Patient tolerated treatment well;No increased pain    Behavior During Therapy  WFL for tasks assessed/performed       Past Medical History:  Diagnosis Date  . Hx of varicella   . Vitamin D deficiency     Past Surgical History:  Procedure Laterality Date  . squamous cell carcinoma removal      There were no vitals filed for this visit.  Subjective Assessment - 01/31/18 0935    Subjective  It is about the same.  Some Lt LE pain over the weekend.  I haven't done any walking    Currently in Pain?  No/denies                       [facility] Adult PT Treatment/Exercise - 01/31/18 0001      Exercises   Exercises  Lumbar;Knee/Hip      Lumbar Exercises: Stretches   Active Hamstring Stretch  Left;Right;3 reps;20 seconds      Lumbar Exercises: Aerobic   Nustep  Level 2 x 6 minutes    PT present to discuss progress     Knee/Hip Exercises: Stretches   Piriformis Stretch  2 reps;Left;30 seconds    Piriformis Stretch Limitations  seated       Modalities   Modalities  Moist Heat      Moist Heat Therapy   Number Minutes Moist Heat  10 Minutes    Moist Heat Location  Hip      Manual Therapy   Manual Therapy  Soft tissue mobilization    Manual therapy comments  soft tissue elongation to Lt gluteals in Rt sidelying       Trigger Point Dry Needling -  01/31/18 0942    Consent Given?  Yes    Education Handout Provided  Yes    Muscles Treated Lower Body  Gluteus maximus;Gluteus minimus;Piriformis   Lt only   Gluteus Maximus Response  Twitch response elicited;Palpable increased muscle length    Gluteus Minimus Response  Twitch response elicited;Palpable increased muscle length    Piriformis Response  Twitch response elicited;Palpable increased muscle length           PT Education - 01/31/18 0948    Education Details  DN info,  Access Code: [email or web address]     Person(s) Educated  Patient    Methods  Explanation;Demonstration;Handout    Comprehension  Verbalized understanding;Returned demonstration       PT Short Term Goals - 01/25/18 1022      PT SHORT TERM GOAL #1   Title  Pt will demo consistency and independence with her HEP to improve LE flexibility and strength.    Time  3    Period  Weeks    Status  New    Target  Date  02/15/18        PT Long Term Goals - 01/25/18 1023      PT LONG TERM GOAL #1   Title  Pt will demo improved LE strength to 5/5 MMT which will improve her efficiency with activity around her house.     Time  6    Period  Weeks    Status  New    Target Date  03/10/18      PT LONG TERM GOAL #2   Title  Pt will be able to maintain single leg stance on the Lt atleast 10 sec without trendelenburg or LOB, to reflect improvements in single leg stability and strength.     Time  6    Period  Weeks    Status  New      PT LONG TERM GOAL #3   Title  Pt will report improved activity tolerance evident by her ability to return to walking atleast 2 miles, 4 days a week without increase in buttock pain.     Time  6    Period  Weeks    Status  New      PT LONG TERM GOAL #4   Title  Pt will be able to complete BLE squat without Lt knee valgus deviation, 2/3 trials, to prevent compensatory patterns during the day while picking up objects from the floor.     Time  6    Period  Weeks    Status  New      PT LONG  TERM GOAL #5   Title  Pt will report atleast 75% improvement in her pain from the start of PT which will improve her ability to complete activity with her grandchildren.     Time  6    Period  Weeks    Status  New            Plan - 01/31/18 3149    Clinical Impression Statement  Pt with first session after evaluation.  Pt has been stretching consistently and HS stretch was added to HEP today.  Pt with increased stiffness of Rt hip vs Lt today and pt was encouraged to stretch bilaterally. Pt reports intermittent Lt gluteal and LE pain without pattern.  Pt with increased trigger points and tension in Lt gluteals and demonstrated improved tissue mobility after dry needling.  Pt with hip weakness and will benefit from advancement of HEP for strength next session.      Rehab Potential  Good    PT Frequency  2x / week    PT Duration  6 weeks    PT Treatment/Interventions  ADLs/Self Care Home Management;Moist Heat;Cryotherapy;Iontophoresis 4mg /ml Dexamethasone;Therapeutic exercise;Therapeutic activities;Functional mobility training;Stair training;Balance training;Neuromuscular re-education;Patient/family education;Manual techniques;Passive range of motion;Taping;Dry needling    PT Next Visit Plan  review HS stretch, assess response to dry needling.  Add hip strength to HEP    PT Home Exercise Plan   Access Code: [email or web address]     Consulted and Agree with Plan of Care  Patient       Patient will benefit from skilled therapeutic intervention in order to improve the following deficits and impairments:  Decreased activity tolerance, Decreased strength, Postural dysfunction, Pain, Improper body mechanics, Decreased range of motion, Decreased balance  Visit Diagnosis: Pain in left leg  Muscle weakness (generalized)     Problem List Patient Active Problem List   Diagnosis Date Noted  . Family history of first degree relative with dementia vascular  03/06/2012  .  Family history of dementia mom  03/06/2012  . Family history of vascular disease mom  03/06/2012  . Elevated blood pressure 02/14/2012  . Elevated cholesterol 02/14/2012     Sigurd Sos, PT 01/31/18 10:18 AM  [facility] [address], [place], [place], [address] Phone: [phone]   Fax:  [phone]  Name: Susan Taylor MRN: [id number] Date of Birth: 02/02/1946

## 2018-02-02 ENCOUNTER — Ambulatory Visit: Payer: Medicare Other | Admitting: Physical Therapy

## 2018-02-02 ENCOUNTER — Encounter: Payer: Self-pay | Admitting: Physical Therapy

## 2018-02-02 DIAGNOSIS — M79605 Pain in left leg: Secondary | ICD-10-CM

## 2018-02-02 DIAGNOSIS — M6281 Muscle weakness (generalized): Secondary | ICD-10-CM

## 2018-02-02 NOTE — Patient Instructions (Signed)
Access Code: [email or web address]  URL: [email or web address]  Date: 02/02/2018  Prepared by: Elly Modena   Exercises  Seated Figure 4 Piriformis Stretch - 3 reps - 1 sets - 20 hold - 3x daily - 7x weekly  Supine Bridge with Resistance Band - 10 reps - 3 sets - 1x daily - 7x weekly  Clamshell with Resistance - 10 reps - 2 sets - 1x daily - 7x weekly  Hip Flexor Stretch at [facility] of Bed - 2 reps - 30 hold - 1x daily - 7x weekly    [facility] [address], [place], [place] [address] Phone # [phone] Fax [phone]

## 2018-02-02 NOTE — Therapy (Signed)
[facility] [address] [place], [place], [address] Phone: [phone]   Fax:  [phone]  Physical Therapy Treatment  Patient Details  Name: Susan Taylor MRN: [id number] Date of Birth: 09/09/1945 Referring Provider: Dineen Kid, MD   Encounter Date: 02/02/2018  PT End of Session - 02/02/18 0929    Visit Number  3    Date for PT Re-Evaluation  03/10/18    Authorization Type  UHC Medicare    Authorization Time Period  01/25/18 to 03/10/18    PT Start Time  0845    PT Stop Time  0928    PT Time Calculation (min)  43 min    Activity Tolerance  Patient tolerated treatment well;No increased pain    Behavior During Therapy  WFL for tasks assessed/performed       Past Medical History:  Diagnosis Date  . Hx of varicella   . Vitamin D deficiency     Past Surgical History:  Procedure Laterality Date  . squamous cell carcinoma removal      There were no vitals filed for this visit.  Subjective Assessment - 02/02/18 0847    Subjective  Pt reports that she has not been able to do any serious walking. She is not sure if the needling helped, but she knows that nothing is worse.     Currently in Pain?  No/denies   no pain but can feel a tightness along the outside of her knee                       [facility] Adult PT Treatment/Exercise - 02/02/18 0001      Exercises   Exercises  Knee/Hip      Lumbar Exercises: Aerobic   Nustep  L2 x4 min, PT present to discuss dry needling results       Knee/Hip Exercises: Stretches   Hip Flexor Stretch  2 reps;Both;30 seconds    Hip Flexor Stretch Limitations  supine     Other Knee/Hip Stretches  Lt and Rt glute stretch 2x15 sec each       Knee/Hip Exercises: Standing   Hip ADduction  Both;1 set;10 reps    Hip ADduction Limitations  standing closed chain slide     Other Standing Knee Exercises  Rt and Lt hip IR/ER with overpressure x10 reps each    LE on mat  table; ER with red TB resistance x10 reps each      Knee/Hip Exercises: Supine   Bridges  Both;1 set;10 reps    Bridges Limitations  red TB around knees     Other Supine Knee/Hip Exercises  abdominal brace with bent knee 90/90 alternating heel tap x10 reps       Knee/Hip Exercises: Sidelying   Clams  red TB x10 reps each       Manual Therapy   Manual Therapy  Myofascial release;Soft tissue mobilization    Soft tissue mobilization  STM distal lateral quad/ITB    Myofascial Release  Trigger point release Lt glute med              PT Education - 02/02/18 0918    Education Details  updated HEP; technique with therex     Person(s) Educated  Patient    Methods  Demonstration;Explanation;Verbal cues;Handout    Comprehension  Verbalized understanding;Returned demonstration       PT Short Term Goals - 02/02/18 0931      PT SHORT  TERM GOAL #1   Title  Pt will demo consistency and independence with her HEP to improve LE flexibility and strength.    Time  3    Period  Weeks    Status  Achieved        PT Long Term Goals - 01/25/18 1023      PT LONG TERM GOAL #1   Title  Pt will demo improved LE strength to 5/5 MMT which will improve her efficiency with activity around her house.     Time  6    Period  Weeks    Status  New    Target Date  03/10/18      PT LONG TERM GOAL #2   Title  Pt will be able to maintain single leg stance on the Lt atleast 10 sec without trendelenburg or LOB, to reflect improvements in single leg stability and strength.     Time  6    Period  Weeks    Status  New      PT LONG TERM GOAL #3   Title  Pt will report improved activity tolerance evident by her ability to return to walking atleast 2 miles, 4 days a week without increase in buttock pain.     Time  6    Period  Weeks    Status  New      PT LONG TERM GOAL #4   Title  Pt will be able to complete BLE squat without Lt knee valgus deviation, 2/3 trials, to prevent compensatory patterns during  the day while picking up objects from the floor.     Time  6    Period  Weeks    Status  New      PT LONG TERM GOAL #5   Title  Pt will report atleast 75% improvement in her pain from the start of PT which will improve her ability to complete activity with her grandchildren.     Time  6    Period  Weeks    Status  New            Plan - 02/02/18 1884    Clinical Impression Statement  Pt arrived without pain, and is unsure if there was any improvement following dry needling last session. Today focused on progressing pt's HEP with the addition of LE strengthening exercises. Pt had increased difficulty with clamshells on the Lt more than the Rt but was able to complete all exercises without increase in pain. End of session therapist completed manual techniques to the Lt glute/lateral thigh and pt reported decrease in tightness end of session. Will continue with current POC.    Rehab Potential  Good    PT Frequency  2x / week    PT Duration  6 weeks    PT Treatment/Interventions  ADLs/Self Care Home Management;Moist Heat;Cryotherapy;Iontophoresis 4mg /ml Dexamethasone;Therapeutic exercise;Therapeutic activities;Functional mobility training;Stair training;Balance training;Neuromuscular re-education;Patient/family education;Manual techniques;Passive range of motion;Taping;Dry needling    PT Next Visit Plan  progress hip rotation strength and flexibility, hip extension strength, manual to lateral quad/glutes     PT Home Exercise Plan   Access Code: [email or web address]     Consulted and Agree with Plan of Care  Patient       Patient will benefit from skilled therapeutic intervention in order to improve the following deficits and impairments:  Decreased activity tolerance, Decreased strength, Postural dysfunction, Pain, Improper body mechanics, Decreased range of motion, Decreased balance  Visit Diagnosis: Pain in left leg  Muscle weakness (generalized)     Problem List Patient Active Problem List    Diagnosis Date Noted  . Family history of first degree relative with dementia vascular  03/06/2012  . Family history of dementia mom 03/06/2012  . Family history of vascular disease mom  03/06/2012  . Elevated blood pressure 02/14/2012  . Elevated cholesterol 02/14/2012     9:32 AM,02/02/18 Sherol Dade PT, DPT [facility] [address] [place], [place], [address] Phone: [phone]   Fax:  [phone]  Name: Susan Taylor MRN: [id number] Date of Birth: [birth year]

## 2018-02-07 ENCOUNTER — Ambulatory Visit: Payer: Medicare Other | Attending: Family Medicine

## 2018-02-07 DIAGNOSIS — M79605 Pain in left leg: Secondary | ICD-10-CM | POA: Diagnosis present

## 2018-02-07 DIAGNOSIS — M6281 Muscle weakness (generalized): Secondary | ICD-10-CM | POA: Diagnosis present

## 2018-02-07 NOTE — Therapy (Addendum)
[facility] [address], [place], [place], [address] Phone: [phone]   Fax:  [phone]  Physical Therapy Treatment  Patient Details  Name: Susan Taylor MRN: [id number] Date of Birth: December 18, 1945 Referring Provider: Dineen Kid, MD   Encounter Date: 02/07/2018  PT End of Session - 02/07/18 1014    Visit Number  4    Date for PT Re-Evaluation  03/10/18    Authorization Type  UHC Medicare    Authorization Time Period  01/25/18 to 03/10/18    PT Start Time  0935   dry needling   PT Stop Time  1024    PT Time Calculation (min)  49 min    Activity Tolerance  Patient tolerated treatment well;No increased pain    Behavior During Therapy  WFL for tasks assessed/performed       Past Medical History:  Diagnosis Date  . Hx of varicella   . Vitamin D deficiency     Past Surgical History:  Procedure Laterality Date  . squamous cell carcinoma removal      There were no vitals filed for this visit.  Subjective Assessment - 02/07/18 0938    Subjective  Pt reports pain isn't extending into her buttock and hip as much.    Currently in Pain?  Yes    Pain Score  1     Pain Location  Buttocks    Pain Orientation  Left;Posterior    Pain Descriptors / Indicators  Aching    Pain Type  Acute pain    Pain Onset  More than a month ago    Pain Frequency  Intermittent    Aggravating Factors   prolonged standing    Pain Relieving Factors  stretching                       [facility] Adult PT Treatment/Exercise - 02/07/18 0942      Lumbar Exercises: Stretches   Hip Flexor Stretch  Left;20 seconds;3 reps   supine with leg off Lt edge of table     Knee/Hip Exercises: Stretches   Active Hamstring Stretch  Both;30 seconds;2 reps   seated     Knee/Hip Exercises: Seated   Other Seated Knee/Hip Exercises  Figure 4 stretch, both 2x30 seconds      Knee/Hip Exercises: Supine   Bridges  Both;1 set;10 reps   red TB  around knees     Knee/Hip Exercises: Sidelying   Clams  red TB x10 reps each      Moist Heat Therapy   Number Minutes Moist Heat  10 Minutes    Moist Heat Location  Hip      Manual Therapy   Manual Therapy  Soft tissue mobilization    Manual therapy comments  soft tissue elongation to Lt gluteals in Rt sidelying       Trigger Point Dry Needling - 02/07/18 0955    Consent Given?  Yes    Muscles Treated Lower Body  Gluteus minimus;Gluteus maximus;Piriformis   Lt only   Gluteus Maximus Response  Twitch response elicited;Palpable increased muscle length    Gluteus Minimus Response  Twitch response elicited;Palpable increased muscle length    Piriformis Response  Twitch response elicited;Palpable increased muscle length             PT Short Term Goals - 02/02/18 0931      PT SHORT TERM GOAL #1   Title  Pt will demo consistency  and independence with her HEP to improve LE flexibility and strength.    Time  3    Period  Weeks    Status  Achieved        PT Long Term Goals - 01/25/18 1023      PT LONG TERM GOAL #1   Title  Pt will demo improved LE strength to 5/5 MMT which will improve her efficiency with activity around her house.     Time  6    Period  Weeks    Status  New    Target Date  03/10/18      PT LONG TERM GOAL #2   Title  Pt will be able to maintain single leg stance on the Lt atleast 10 sec without trendelenburg or LOB, to reflect improvements in single leg stability and strength.     Time  6    Period  Weeks    Status  New      PT LONG TERM GOAL #3   Title  Pt will report improved activity tolerance evident by her ability to return to walking atleast 2 miles, 4 days a week without increase in buttock pain.     Time  6    Period  Weeks    Status  New      PT LONG TERM GOAL #4   Title  Pt will be able to complete BLE squat without Lt knee valgus deviation, 2/3 trials, to prevent compensatory patterns during the day while picking up objects from the  floor.     Time  6    Period  Weeks    Status  New      PT LONG TERM GOAL #5   Title  Pt will report atleast 75% improvement in her pain from the start of PT which will improve her ability to complete activity with her grandchildren.     Time  6    Period  Weeks    Status  New            Plan - 02/07/18 2229    Clinical Impression Statement  Pt reports 20% overall improvement in pain symptoms since the start of care.  Pt reports Lt gluteal pain mostly with standing long periods.  Pt has tried to return to walking for exercise and reports this is painful after only a few minutes.  Pt will continue to try this.  Pt is independent in current HEP and minor tactile and verbal cues for technique.  Pt with trigger points and tension in the Lt gluteals and demonstrated improved tissue mobility after dry needling today.  Pt will continue to benefit from skilled PT for core and hip strength and manual therapy as needed.      Rehab Potential  Good    PT Frequency  2x / week    PT Duration  6 weeks    PT Treatment/Interventions  ADLs/Self Care Home Management;Moist Heat;Cryotherapy;Iontophoresis 4mg /ml Dexamethasone;Therapeutic exercise;Therapeutic activities;Functional mobility training;Stair training;Balance training;Neuromuscular re-education;Patient/family education;Manual techniques;Passive range of motion;Taping;Dry needling    PT Next Visit Plan  assess response to dry needling, continue hip and core strength    PT Home Exercise Plan   Access Code: [email or web address]     Recommended Other Services  initial cert is signed    Consulted and Agree with Plan of Care  Patient       Patient will benefit from skilled therapeutic intervention in order to improve the following deficits and impairments:  Decreased  activity tolerance, Decreased strength, Postural dysfunction, Pain, Improper body mechanics, Decreased range of motion, Decreased balance  Visit Diagnosis: Pain in left leg  Muscle weakness  (generalized)     Problem List Patient Active Problem List   Diagnosis Date Noted  . Family history of first degree relative with dementia vascular  03/06/2012  . Family history of dementia mom 03/06/2012  . Family history of vascular disease mom  03/06/2012  . Elevated blood pressure 02/14/2012  . Elevated cholesterol 02/14/2012     Sigurd Sos, PT 02/07/18 10:17 AM Baruch Merl, PT 02/07/18 11:12 AM   [facility] [address], [place], [place], [address] Phone: [phone]   Fax:  [phone]  Name: SEONA CLEMENSON MRN: [id number] Date of Birth: 1946/05/17

## 2018-02-16 ENCOUNTER — Ambulatory Visit: Payer: Medicare Other

## 2018-02-16 DIAGNOSIS — M79605 Pain in left leg: Secondary | ICD-10-CM | POA: Diagnosis not present

## 2018-02-16 DIAGNOSIS — M6281 Muscle weakness (generalized): Secondary | ICD-10-CM

## 2018-02-16 NOTE — Therapy (Addendum)
[facility] [address] [place], [place], [address] Phone: [phone]   Fax:  [phone]  Physical Therapy Treatment  Patient Details  Name: Susan Taylor MRN: [id number] Date of Birth: 1945-12-17 Referring Provider: Dineen Kid, MD   Encounter Date: 02/16/2018  PT End of Session - 02/16/18 1014    Visit Number  5    Date for PT Re-Evaluation  03/10/18    Authorization Type  UHC Medicare    PT Start Time  0932    PT Stop Time  9233    PT Time Calculation (min)  51 min    Activity Tolerance  Patient tolerated treatment well;No increased pain    Behavior During Therapy  WFL for tasks assessed/performed       Past Medical History:  Diagnosis Date  . Hx of varicella   . Vitamin D deficiency     Past Surgical History:  Procedure Laterality Date  . squamous cell carcinoma removal      There were no vitals filed for this visit.  Subjective Assessment - 02/16/18 0935    Subjective  I am not feeling good.  I think that I re-injured myself.  I walked at [employer] a week ago and have had high pain levels ever since.      Patient Stated Goals  back to walking 3 miles, 4x/week     Currently in Pain?  Yes    Pain Score  8     Pain Location  Buttocks    Pain Orientation  Left;Proximal    Pain Descriptors / Indicators  Aching    Pain Type  Acute pain    Pain Onset  More than a month ago    Pain Frequency  Intermittent    Aggravating Factors   standing long periods,     Pain Relieving Factors  not able to relieve the pain                       [facility] Adult PT Treatment/Exercise - 02/16/18 0001      Modalities   Modalities  Moist Heat;Electrical Stimulation      Moist Heat Therapy   Number Minutes Moist Heat  10 Minutes    Moist Heat Location  Hip      Electrical Stimulation   Electrical Stimulation Location  Bil lumbar paraspinals and Lt gluteals    Electrical Stimulation Action  IFC     Electrical Stimulation Parameters  15 minutes    Electrical Stimulation Goals  Pain      Manual Therapy   Manual Therapy  Soft tissue mobilization    Manual therapy comments  soft tissue elongation to Lt gluteals in Rt sidelying    Soft tissue mobilization  STM distal lateral quad/ITB       Trigger Point Dry Needling - 02/16/18 1013    Consent Given?  Yes    Muscles Treated Lower Body  Gluteus minimus;Gluteus maximus;Piriformis   Lt gluteals, Rt lumbar multifidi and 2 Lt multifidi.   Gluteus Maximus Response  Twitch response elicited;Palpable increased muscle length    Gluteus Minimus Response  Twitch response elicited;Palpable increased muscle length    Piriformis Response  Twitch response elicited;Palpable increased muscle length             PT Short Term Goals - 02/16/18 0940      PT SHORT TERM GOAL #1   Title  Pt will demo consistency and  independence with her HEP to improve LE flexibility and strength.    Status  Achieved        PT Long Term Goals - 02/16/18 0940      PT LONG TERM GOAL #3   Title  Pt will report improved activity tolerance evident by her ability to return to walking atleast 2 miles, 4 days a week without increase in buttock pain.     Baseline  flare-up this week    Time  6    Period  Weeks    Status  On-going            Plan - 02/16/18 1012    Clinical Impression Statement  Pt with a flare-up of pain since last visit and reports 8/10 Lt gluteal pain.  Pt was tearful throughout session today and hypersensitive to dry needling overall and not able to tolerate needling to Lt lumbar multifidi.  Pt is going to speak with MD regarding referral to see a specialist.  PT also encouraged pt to return to HEP as long as it doesn't flare-up her pain.  Treatment focused on manual and electrical stimulation for pain management.  Pt reported reduced pain after treatment.  Pt will continue to benefit from skilled PT to address Lt LE pain.     Rehab  Potential  Good    PT Frequency  2x / week    PT Duration  6 weeks    PT Treatment/Interventions  ADLs/Self Care Home Management;Moist Heat;Cryotherapy;Iontophoresis '4mg'$ /ml Dexamethasone;Therapeutic exercise;Therapeutic activities;Functional mobility training;Stair training;Balance training;Neuromuscular re-education;Patient/family education;Manual techniques;Passive range of motion;Taping;Dry needling    PT Next Visit Plan  assess response to dry needling, continue hip and core strength    PT Home Exercise Plan   Access Code: [email or web address]     Consulted and Agree with Plan of Care  Patient       Patient will benefit from skilled therapeutic intervention in order to improve the following deficits and impairments:  Decreased activity tolerance, Decreased strength, Postural dysfunction, Pain, Improper body mechanics, Decreased range of motion, Decreased balance  Visit Diagnosis: Pain in left leg  Muscle weakness (generalized)     Problem List Patient Active Problem List   Diagnosis Date Noted  . Family history of first degree relative with dementia vascular  03/06/2012  . Family history of dementia mom 03/06/2012  . Family history of vascular disease mom  03/06/2012  . Elevated blood pressure 02/14/2012  . Elevated cholesterol 02/14/2012    Sigurd Sos, PT 02/16/18 10:16 AM PHYSICAL THERAPY DISCHARGE SUMMARY  Visits from Start of Care: 5  Current functional level related to goals / functional outcomes: Pt called to cancel remaining appointments due to going to the MD for further testing.     Remaining deficits: See above for most recent status.     Education / Equipment: HEP  Plan: Patient agrees to discharge.  Patient goals were not met. Patient is being discharged due to a change in medical status.  ?????         Sigurd Sos, PT 02/27/18 8:30 AM  [facility] [address] [place], [place],  [address] Phone: [phone]   Fax:  [phone]  Name: Susan Taylor MRN: [id number] Date of Birth: 1945/12/21

## 2018-02-21 ENCOUNTER — Other Ambulatory Visit: Payer: Self-pay | Admitting: Orthopedic Surgery

## 2018-02-21 DIAGNOSIS — G8929 Other chronic pain: Secondary | ICD-10-CM

## 2018-02-21 DIAGNOSIS — M545 Low back pain: Principal | ICD-10-CM

## 2018-02-23 ENCOUNTER — Ambulatory Visit: Payer: Medicare Other

## 2018-03-03 ENCOUNTER — Ambulatory Visit
Admission: RE | Admit: 2018-03-03 | Discharge: 2018-03-03 | Disposition: A | Discharge status: Home / Self Care | Payer: Medicare Other | Source: Ambulatory Visit | Attending: Orthopedic Surgery | Admitting: Orthopedic Surgery

## 2018-03-03 DIAGNOSIS — M545 Low back pain: Principal | ICD-10-CM

## 2018-03-03 DIAGNOSIS — G8929 Other chronic pain: Secondary | ICD-10-CM

## 2018-03-03 MED ORDER — DIAZEPAM 5 MG PO TABS
5.0000 mg | ORAL_TABLET | Freq: Once | ORAL | Status: AC
  Administered 2018-03-03: 5 mg via ORAL

## 2018-03-03 MED ORDER — IOPAMIDOL (ISOVUE-M 200) INJECTION 41%
15.0000 mL | Freq: Once | INTRAMUSCULAR | Status: AC
  Administered 2018-03-03: 15 mL via INTRATHECAL

## 2018-03-03 NOTE — Discharge Instructions (Signed)

## 2018-04-03 ENCOUNTER — Other Ambulatory Visit: Payer: Self-pay | Admitting: Neurosurgery

## 2018-04-06 NOTE — Pre-Procedure Instructions (Signed)
Susan Taylor  04/06/2018      [id number] [place], [place] [address] [place] [address] Phone: [phone] Fax: [phone]  [id number] - [place], [place] [address] Phone: [phone] Fax: [phone]    Your procedure is scheduled on April 13, 2018.  Report to [facility] Admitting at 530 AM.  Call this number if you have problems the morning of surgery:  [phone]   Remember:  Do not eat or drink after midnight.    Take these medicines the morning of surgery with A SIP OF WATER  Hydrocodone-acetaminophen (norco)-if needed for pain  Follow your surgeon's instructions on when to hold/resume aspirin.  If no instructions were given call the office to determine how they would like to you take aspirin  7 days prior to surgery STOP taking any Aleve, Naproxen, Ibuprofen, Motrin, Advil, Goody's, BC's, all herbal medications, fish oil, and all vitamins     Do not wear jewelry, make-up or nail polish.  Do not wear lotions, powders, or perfumes, or deodorant.  Do not shave 48 hours prior to surgery.    Do not bring valuables to the hospital.  [facility] is not responsible for any belongings or valuables.  Contacts, dentures or bridgework may not be worn into surgery.  Leave your suitcase in the car.  After surgery it may be brought to your room.  For patients admitted to the hospital, discharge time will be determined by your treatment team.  Patients discharged the day of surgery will not be allowed to drive home.   [place]- Preparing For Surgery  Before surgery, you can play an important role. Because skin is not sterile, your skin needs to be as free of germs as possible. You can reduce the number of germs on your skin by washing with CHG (chlorahexidine gluconate) Soap before surgery.  CHG is an  antiseptic cleaner which kills germs and bonds with the skin to continue killing germs even after washing.    Oral Hygiene is also important to reduce your risk of infection.  Remember - BRUSH YOUR TEETH THE MORNING OF SURGERY WITH YOUR REGULAR TOOTHPASTE  Please do not use if you have an allergy to CHG or antibacterial soaps. If your skin becomes reddened/irritated stop using the CHG.  Do not shave (including legs and underarms) for at least 48 hours prior to first CHG shower. It is OK to shave your face.  Please follow these instructions carefully.   1. Shower the NIGHT BEFORE SURGERY and the MORNING OF SURGERY with CHG.   2. If you chose to wash your hair, wash your hair first as usual with your normal shampoo.  3. After you shampoo, rinse your hair and body thoroughly to remove the shampoo.  4. Use CHG as you would any other liquid soap. You can apply CHG directly to the skin and wash gently with a scrungie or a clean washcloth.   5. Apply the CHG Soap to your body ONLY FROM THE NECK DOWN.  Do not use on open wounds or open sores. Avoid contact with your eyes, ears, mouth and genitals (private parts). Wash Face and genitals (private parts)  with your normal soap.  6. Wash thoroughly, paying special attention to the area where your surgery will be performed.  7. Thoroughly rinse your body with warm water from  the neck down.  8. DO NOT shower/wash with your normal soap after using and rinsing off the CHG Soap.  9. Pat yourself dry with a CLEAN TOWEL.  10. Wear CLEAN PAJAMAS to bed the night before surgery, wear comfortable clothes the morning of surgery  11. Place CLEAN SHEETS on your bed the night of your first shower and DO NOT SLEEP WITH PETS.  Day of Surgery:  Do not apply any deodorants/lotions.  Please wear clean clothes to the hospital/surgery center.   Remember to brush your teeth WITH YOUR REGULAR TOOTHPASTE.   Please read over the fact sheets that you were  given.

## 2018-04-07 ENCOUNTER — Encounter (HOSPITAL_COMMUNITY)
Admission: RE | Admit: 2018-04-07 | Discharge: 2018-04-07 | Disposition: A | Discharge status: Home / Self Care | Payer: Medicare Other | Source: Ambulatory Visit | Attending: Neurosurgery | Admitting: Neurosurgery

## 2018-04-07 ENCOUNTER — Encounter (HOSPITAL_COMMUNITY): Payer: Self-pay

## 2018-04-07 ENCOUNTER — Other Ambulatory Visit: Payer: Self-pay

## 2018-04-07 DIAGNOSIS — Z01818 Encounter for other preprocedural examination: Secondary | ICD-10-CM | POA: Insufficient documentation

## 2018-04-07 DIAGNOSIS — I4510 Unspecified right bundle-branch block: Secondary | ICD-10-CM | POA: Diagnosis not present

## 2018-04-07 HISTORY — DX: Malignant (primary) neoplasm, unspecified: C80.1

## 2018-04-07 HISTORY — DX: Family history of other specified conditions: Z84.89

## 2018-04-07 LAB — BASIC METABOLIC PANEL
Anion gap: 7 (ref 5–15)
BUN: 12 mg/dL (ref 8–23)
CALCIUM: 9.5 mg/dL (ref 8.9–10.3)
CO2: 27 mmol/L (ref 22–32)
CREATININE: 0.88 mg/dL (ref 0.44–1.00)
Chloride: 104 mmol/L (ref 98–111)
GFR calc Af Amer: >60 mL/min (ref >60)
GLUCOSE: 105 mg/dL — AB (ref 70–99)
Potassium: 3.9 mmol/L (ref 3.5–5.1)
SODIUM: 138 mmol/L (ref 135–145)

## 2018-04-07 LAB — ABO/RH: ABO/RH(D): B POS

## 2018-04-07 LAB — SURGICAL PCR SCREEN
MRSA, PCR: NEGATIVE
Staphylococcus aureus: NEGATIVE

## 2018-04-07 LAB — CBC
HCT: 42.1 % (ref 36.0–46.0)
HEMOGLOBIN: 13.7 g/dL (ref 12.0–15.0)
MCH: 31.5 pg (ref 26.0–34.0)
MCHC: 32.5 g/dL (ref 30.0–36.0)
MCV: 96.8 fL (ref 80.0–100.0)
PLATELETS: 266 10*3/uL (ref 150–400)
RBC: 4.35 MIL/uL (ref 3.87–5.11)
RDW: 11.8 % (ref 11.5–15.5)
WBC: 6.9 10*3/uL (ref 4.0–10.5)
nRBC: 0 % (ref 0.0–0.2)

## 2018-04-07 LAB — TYPE AND SCREEN
ABO/RH(D): B POS
Antibody Screen: NEGATIVE

## 2018-04-07 NOTE — Progress Notes (Signed)
PCP - Dr. Lennette Bihari [address] Cardiologist - denies; denies all cardiac studies  Chest x-ray - N/A EKG - 2018/05/04  Sleep Study - denies  Aspirin Instructions: LD 04/05/18  Anesthesia review: Yes; EKG  Patient denies shortness of breath, fever, cough and chest pain at PAT appointment   Patient verbalized understanding of instructions that were given to them at the PAT appointment. Patient was also instructed that they will need to review over the PAT instructions again at home before surgery.

## 2018-04-12 ENCOUNTER — Encounter (HOSPITAL_COMMUNITY): Payer: Self-pay | Admitting: Certified Registered"

## 2018-04-12 NOTE — Anesthesia Preprocedure Evaluation (Addendum)
Anesthesia Evaluation  Patient identified by MRN, date of birth, ID band Patient awake    Reviewed: Allergy & Precautions, NPO status , Patient's Chart, lab work & pertinent test results  History of Anesthesia Complications Negative for: history of anesthetic complications  Airway Mallampati: II  TM Distance: >3 FB Neck ROM: Full    Dental  (+) Dental Advisory Given   Pulmonary neg pulmonary ROS,    breath sounds clear to auscultation       Cardiovascular negative cardio ROS   Rhythm:Regular Rate:Normal     Neuro/Psych Back pain    GI/Hepatic negative GI ROS, Neg liver ROS,   Endo/Other  negative endocrine ROS  Renal/GU negative Renal ROS     Musculoskeletal negative musculoskeletal ROS (+)   Abdominal   Peds  Hematology negative hematology ROS (+)   Anesthesia Other Findings   Reproductive/Obstetrics                             Anesthesia Physical Anesthesia Plan  ASA: II  Anesthesia Plan: General   Post-op Pain Management:    Induction: Intravenous  PONV Risk Score and Plan: 4 or greater and Dexamethasone, Ondansetron and Scopolamine patch - Pre-op  Airway Management Planned: Oral ETT  Additional Equipment:   Intra-op Plan:   Post-operative Plan: Extubation in OR  Informed Consent: I have reviewed the patients History and Physical, chart, labs and discussed the procedure including the risks, benefits and alternatives for the proposed anesthesia with the patient or authorized representative who has indicated his/her understanding and acceptance.   Dental advisory given  Plan Discussed with: CRNA and Surgeon  Anesthesia Plan Comments: (Plan routine monitors, GETA)       Anesthesia Quick Evaluation

## 2018-04-13 ENCOUNTER — Inpatient Hospital Stay (HOSPITAL_COMMUNITY)
Admission: RE | Disposition: A | Discharge status: Home / Self Care | Payer: Self-pay | Source: Home / Self Care | Attending: Neurosurgery

## 2018-04-13 ENCOUNTER — Inpatient Hospital Stay (HOSPITAL_COMMUNITY): Payer: Medicare Other | Admitting: Physician Assistant

## 2018-04-13 ENCOUNTER — Inpatient Hospital Stay (HOSPITAL_COMMUNITY): Payer: Medicare Other | Admitting: Certified Registered"

## 2018-04-13 ENCOUNTER — Inpatient Hospital Stay (HOSPITAL_COMMUNITY)
Admission: RE | Admit: 2018-04-13 | Discharge: 2018-04-14 | DRG: 458 | Disposition: A | Discharge status: Home / Self Care | Payer: Medicare Other | Attending: Neurosurgery | Admitting: Neurosurgery

## 2018-04-13 ENCOUNTER — Encounter (HOSPITAL_COMMUNITY): Payer: Self-pay | Admitting: *Deleted

## 2018-04-13 ENCOUNTER — Inpatient Hospital Stay (HOSPITAL_COMMUNITY): Payer: Medicare Other

## 2018-04-13 DIAGNOSIS — Z85828 Personal history of other malignant neoplasm of skin: Secondary | ICD-10-CM

## 2018-04-13 DIAGNOSIS — Z8249 Family history of ischemic heart disease and other diseases of the circulatory system: Secondary | ICD-10-CM

## 2018-04-13 DIAGNOSIS — M5117 Intervertebral disc disorders with radiculopathy, lumbosacral region: Secondary | ICD-10-CM | POA: Diagnosis present

## 2018-04-13 DIAGNOSIS — M4126 Other idiopathic scoliosis, lumbar region: Principal | ICD-10-CM | POA: Diagnosis present

## 2018-04-13 DIAGNOSIS — M48061 Spinal stenosis, lumbar region without neurogenic claudication: Secondary | ICD-10-CM | POA: Diagnosis present

## 2018-04-13 DIAGNOSIS — Z419 Encounter for procedure for purposes other than remedying health state, unspecified: Secondary | ICD-10-CM

## 2018-04-13 DIAGNOSIS — Z79899 Other long term (current) drug therapy: Secondary | ICD-10-CM

## 2018-04-13 DIAGNOSIS — E7800 Pure hypercholesterolemia, unspecified: Secondary | ICD-10-CM | POA: Diagnosis present

## 2018-04-13 DIAGNOSIS — M419 Scoliosis, unspecified: Secondary | ICD-10-CM | POA: Diagnosis present

## 2018-04-13 DIAGNOSIS — I10 Essential (primary) hypertension: Secondary | ICD-10-CM | POA: Diagnosis present

## 2018-04-13 DIAGNOSIS — M549 Dorsalgia, unspecified: Secondary | ICD-10-CM | POA: Diagnosis present

## 2018-04-13 SURGERY — POSTERIOR LUMBAR FUSION 1 LEVEL
Anesthesia: General | Site: Back

## 2018-04-13 MED ORDER — SODIUM CHLORIDE 0.9 % IV SOLN: 250.0000 mL | INTRAVENOUS | Status: DC

## 2018-04-13 MED ORDER — THROMBIN 5000 UNITS EX SOLR
OROMUCOSAL | Status: DC | PRN
  Administered 2018-04-13: 08:00:00 via TOPICAL

## 2018-04-13 MED ORDER — FENTANYL CITRATE (PF) 250 MCG/5ML IJ SOLN
INTRAMUSCULAR | Status: DC | PRN
  Administered 2018-04-13: 100 ug via INTRAVENOUS
  Administered 2018-04-13 (×3): 50 ug via INTRAVENOUS

## 2018-04-13 MED ORDER — FLEET ENEMA 7-19 GM/118ML RE ENEM: 1.0000 | ENEMA | Freq: Once | RECTAL | Status: DC | PRN

## 2018-04-13 MED ORDER — ALBUMIN HUMAN 5 % IV SOLN
INTRAVENOUS | Status: DC | PRN
  Administered 2018-04-13: 09:00:00 via INTRAVENOUS

## 2018-04-13 MED ORDER — HYDROMORPHONE HCL 1 MG/ML IJ SOLN
0.2500 mg | INTRAMUSCULAR | Status: DC | PRN
  Administered 2018-04-13 (×4): 0.5 mg via INTRAVENOUS

## 2018-04-13 MED ORDER — PANTOPRAZOLE SODIUM 40 MG IV SOLR: 40.0000 mg | Freq: Every day | INTRAVENOUS | Status: DC

## 2018-04-13 MED ORDER — MORPHINE SULFATE (PF) 2 MG/ML IV SOLN
2.0000 mg | INTRAVENOUS | Status: DC | PRN
  Administered 2018-04-13: 2 mg via INTRAVENOUS
  Filled 2018-04-13: qty 1

## 2018-04-13 MED ORDER — MEPERIDINE HCL 50 MG/ML IJ SOLN: 6.2500 mg | INTRAMUSCULAR | Status: DC | PRN

## 2018-04-13 MED ORDER — GABAPENTIN 300 MG PO CAPS
300.0000 mg | ORAL_CAPSULE | Freq: Three times a day (TID) | ORAL | Status: DC
  Administered 2018-04-13 – 2018-04-14 (×2): 300 mg via ORAL
  Filled 2018-04-13 (×2): qty 1

## 2018-04-13 MED ORDER — ONDANSETRON HCL 4 MG PO TABS: 4.0000 mg | ORAL_TABLET | Freq: Four times a day (QID) | ORAL | Status: DC | PRN

## 2018-04-13 MED ORDER — ZOLPIDEM TARTRATE 5 MG PO TABS: 5.0000 mg | ORAL_TABLET | Freq: Every evening | ORAL | Status: DC | PRN

## 2018-04-13 MED ORDER — THROMBIN 5000 UNITS EX SOLR
CUTANEOUS | Status: AC
  Filled 2018-04-13: qty 5000

## 2018-04-13 MED ORDER — EPHEDRINE 5 MG/ML INJ
INTRAVENOUS | Status: AC
  Filled 2018-04-13: qty 10

## 2018-04-13 MED ORDER — BISACODYL 10 MG RE SUPP: 10.0000 mg | Freq: Every day | RECTAL | Status: DC | PRN

## 2018-04-13 MED ORDER — ONDANSETRON HCL 4 MG/2ML IJ SOLN
INTRAMUSCULAR | Status: DC | PRN
  Administered 2018-04-13: 4 mg via INTRAVENOUS

## 2018-04-13 MED ORDER — MENTHOL 3 MG MT LOZG: 1.0000 | LOZENGE | OROMUCOSAL | Status: DC | PRN

## 2018-04-13 MED ORDER — EPHEDRINE SULFATE-NACL 50-0.9 MG/10ML-% IV SOSY
PREFILLED_SYRINGE | INTRAVENOUS | Status: DC | PRN
  Administered 2018-04-13 (×5): 5 mg via INTRAVENOUS

## 2018-04-13 MED ORDER — LACTATED RINGERS IV SOLN
INTRAVENOUS | Status: DC | PRN
  Administered 2018-04-13 (×2): via INTRAVENOUS

## 2018-04-13 MED ORDER — MIDAZOLAM HCL 5 MG/5ML IJ SOLN
INTRAMUSCULAR | Status: DC | PRN
  Administered 2018-04-13: 2 mg via INTRAVENOUS

## 2018-04-13 MED ORDER — CHLORHEXIDINE GLUCONATE CLOTH 2 % EX PADS: 6.0000 | MEDICATED_PAD | Freq: Once | CUTANEOUS | Status: DC

## 2018-04-13 MED ORDER — HYDROXYZINE HCL 50 MG/ML IM SOLN
50.0000 mg | Freq: Four times a day (QID) | INTRAMUSCULAR | Status: DC | PRN
  Administered 2018-04-13: 50 mg via INTRAMUSCULAR
  Filled 2018-04-13: qty 1

## 2018-04-13 MED ORDER — DIAZEPAM 5 MG/ML IJ SOLN
INTRAMUSCULAR | Status: AC
  Filled 2018-04-13: qty 2

## 2018-04-13 MED ORDER — KCL IN DEXTROSE-NACL 20-5-0.45 MEQ/L-%-% IV SOLN: INTRAVENOUS | Status: DC

## 2018-04-13 MED ORDER — CEFAZOLIN SODIUM-DEXTROSE 2-4 GM/100ML-% IV SOLN
2.0000 g | INTRAVENOUS | Status: AC
  Administered 2018-04-13: 2 g via INTRAVENOUS

## 2018-04-13 MED ORDER — ATORVASTATIN CALCIUM 20 MG PO TABS
20.0000 mg | ORAL_TABLET | Freq: Every day | ORAL | Status: DC
  Administered 2018-04-13: 20 mg via ORAL
  Filled 2018-04-13: qty 1

## 2018-04-13 MED ORDER — ACETAMINOPHEN 325 MG PO TABS: 650.0000 mg | ORAL_TABLET | ORAL | Status: DC | PRN

## 2018-04-13 MED ORDER — SODIUM CHLORIDE 0.9% FLUSH: 3.0000 mL | Freq: Two times a day (BID) | INTRAVENOUS | Status: DC

## 2018-04-13 MED ORDER — ACETAMINOPHEN 10 MG/ML IV SOLN
INTRAVENOUS | Status: AC
  Filled 2018-04-13: qty 100

## 2018-04-13 MED ORDER — METHOCARBAMOL 1000 MG/10ML IJ SOLN
500.0000 mg | Freq: Four times a day (QID) | INTRAVENOUS | Status: DC | PRN
  Filled 2018-04-13: qty 5

## 2018-04-13 MED ORDER — PROPOFOL 10 MG/ML IV BOLUS
INTRAVENOUS | Status: DC | PRN
  Administered 2018-04-13: 120 mg via INTRAVENOUS

## 2018-04-13 MED ORDER — OXYCODONE HCL 5 MG PO TABS: 5.0000 mg | ORAL_TABLET | ORAL | Status: DC | PRN

## 2018-04-13 MED ORDER — THROMBIN 20000 UNITS EX SOLR
CUTANEOUS | Status: DC | PRN
  Administered 2018-04-13: 08:00:00 via TOPICAL

## 2018-04-13 MED ORDER — DEXAMETHASONE SODIUM PHOSPHATE 10 MG/ML IJ SOLN
INTRAMUSCULAR | Status: DC | PRN
  Administered 2018-04-13: 5 mg via INTRAVENOUS

## 2018-04-13 MED ORDER — SUGAMMADEX SODIUM 200 MG/2ML IV SOLN
INTRAVENOUS | Status: DC | PRN
  Administered 2018-04-13: 200 mg via INTRAVENOUS

## 2018-04-13 MED ORDER — DEXAMETHASONE SODIUM PHOSPHATE 10 MG/ML IJ SOLN
INTRAMUSCULAR | Status: AC
  Filled 2018-04-13: qty 1

## 2018-04-13 MED ORDER — MIDAZOLAM HCL 2 MG/2ML IJ SOLN: 0.5000 mg | Freq: Once | INTRAMUSCULAR | Status: DC | PRN

## 2018-04-13 MED ORDER — HYDROCODONE-ACETAMINOPHEN 5-325 MG PO TABS
2.0000 | ORAL_TABLET | ORAL | Status: DC | PRN
  Administered 2018-04-13 – 2018-04-14 (×3): 2 via ORAL
  Filled 2018-04-13 (×3): qty 2

## 2018-04-13 MED ORDER — LIDOCAINE 2% (20 MG/ML) 5 ML SYRINGE
INTRAMUSCULAR | Status: AC
  Filled 2018-04-13: qty 5

## 2018-04-13 MED ORDER — ALUM & MAG HYDROXIDE-SIMETH 200-200-20 MG/5ML PO SUSP: 30.0000 mL | Freq: Four times a day (QID) | ORAL | Status: DC | PRN

## 2018-04-13 MED ORDER — SODIUM CHLORIDE 0.9 % IV SOLN
INTRAVENOUS | Status: DC | PRN
  Administered 2018-04-13: 20 ug/min via INTRAVENOUS

## 2018-04-13 MED ORDER — KETOROLAC TROMETHAMINE 30 MG/ML IJ SOLN
INTRAMUSCULAR | Status: AC
  Filled 2018-04-13: qty 2

## 2018-04-13 MED ORDER — VITAMIN D 25 MCG (1000 UNIT) PO TABS
1000.0000 [IU] | ORAL_TABLET | Freq: Every day | ORAL | Status: DC
  Administered 2018-04-14: 1000 [IU] via ORAL

## 2018-04-13 MED ORDER — PROPOFOL 10 MG/ML IV BOLUS
INTRAVENOUS | Status: AC
  Filled 2018-04-13: qty 20

## 2018-04-13 MED ORDER — PANTOPRAZOLE SODIUM 40 MG PO TBEC
40.0000 mg | DELAYED_RELEASE_TABLET | Freq: Every day | ORAL | Status: DC
  Administered 2018-04-13: 40 mg via ORAL
  Filled 2018-04-13: qty 1

## 2018-04-13 MED ORDER — ROCURONIUM BROMIDE 10 MG/ML (PF) SYRINGE
PREFILLED_SYRINGE | INTRAVENOUS | Status: DC | PRN
  Administered 2018-04-13: 20 mg via INTRAVENOUS
  Administered 2018-04-13: 10 mg via INTRAVENOUS
  Administered 2018-04-13: 50 mg via INTRAVENOUS
  Administered 2018-04-13: 20 mg via INTRAVENOUS

## 2018-04-13 MED ORDER — SCOPOLAMINE 1 MG/3DAYS TD PT72
MEDICATED_PATCH | TRANSDERMAL | Status: AC
  Filled 2018-04-13: qty 1

## 2018-04-13 MED ORDER — CEFAZOLIN SODIUM-DEXTROSE 2-4 GM/100ML-% IV SOLN
INTRAVENOUS | Status: AC
  Filled 2018-04-13: qty 100

## 2018-04-13 MED ORDER — FENTANYL CITRATE (PF) 250 MCG/5ML IJ SOLN
INTRAMUSCULAR | Status: AC
  Filled 2018-04-13: qty 5

## 2018-04-13 MED ORDER — HYDROCODONE-ACETAMINOPHEN 5-325 MG PO TABS: 1.0000 | ORAL_TABLET | Freq: Four times a day (QID) | ORAL | Status: DC | PRN

## 2018-04-13 MED ORDER — SCOPOLAMINE 1 MG/3DAYS TD PT72
1.0000 | MEDICATED_PATCH | Freq: Once | TRANSDERMAL | Status: AC
  Administered 2018-04-13: 1 via TRANSDERMAL

## 2018-04-13 MED ORDER — ACETAMINOPHEN 650 MG RE SUPP: 650.0000 mg | RECTAL | Status: DC | PRN

## 2018-04-13 MED ORDER — CALCIUM CARBONATE 1250 (500 CA) MG PO TABS
ORAL_TABLET | Freq: Every day | ORAL | Status: DC
  Administered 2018-04-14: 1250 mg via ORAL
  Filled 2018-04-13 (×2): qty 1

## 2018-04-13 MED ORDER — LIDOCAINE-EPINEPHRINE 1 %-1:100000 IJ SOLN
INTRAMUSCULAR | Status: AC
  Filled 2018-04-13: qty 1

## 2018-04-13 MED ORDER — MIDAZOLAM HCL 2 MG/2ML IJ SOLN
INTRAMUSCULAR | Status: AC
  Filled 2018-04-13: qty 2

## 2018-04-13 MED ORDER — LIDOCAINE-EPINEPHRINE 1 %-1:100000 IJ SOLN
INTRAMUSCULAR | Status: DC | PRN
  Administered 2018-04-13: 5 mL

## 2018-04-13 MED ORDER — DOCUSATE SODIUM 100 MG PO CAPS
100.0000 mg | ORAL_CAPSULE | Freq: Two times a day (BID) | ORAL | Status: DC
  Administered 2018-04-13 – 2018-04-14 (×2): 100 mg via ORAL
  Filled 2018-04-13 (×2): qty 1

## 2018-04-13 MED ORDER — HYDROMORPHONE HCL 1 MG/ML IJ SOLN
INTRAMUSCULAR | Status: AC
  Filled 2018-04-13: qty 1

## 2018-04-13 MED ORDER — CEFAZOLIN SODIUM-DEXTROSE 2-4 GM/100ML-% IV SOLN
2.0000 g | Freq: Three times a day (TID) | INTRAVENOUS | Status: AC
  Administered 2018-04-13 (×2): 2 g via INTRAVENOUS
  Filled 2018-04-13 (×2): qty 100

## 2018-04-13 MED ORDER — ACETAMINOPHEN 10 MG/ML IV SOLN
INTRAVENOUS | Status: DC | PRN
  Administered 2018-04-13: 1000 mg via INTRAVENOUS

## 2018-04-13 MED ORDER — ONDANSETRON HCL 4 MG/2ML IJ SOLN
INTRAMUSCULAR | Status: AC
  Filled 2018-04-13: qty 4

## 2018-04-13 MED ORDER — BUPIVACAINE HCL (PF) 0.5 % IJ SOLN
INTRAMUSCULAR | Status: AC
  Filled 2018-04-13: qty 30

## 2018-04-13 MED ORDER — PHENOL 1.4 % MT LIQD: 1.0000 | OROMUCOSAL | Status: DC | PRN

## 2018-04-13 MED ORDER — PHENYLEPHRINE 40 MCG/ML (10ML) SYRINGE FOR IV PUSH (FOR BLOOD PRESSURE SUPPORT)
PREFILLED_SYRINGE | INTRAVENOUS | Status: DC | PRN
  Administered 2018-04-13: 80 ug via INTRAVENOUS
  Administered 2018-04-13 (×2): 120 ug via INTRAVENOUS

## 2018-04-13 MED ORDER — ESMOLOL HCL 100 MG/10ML IV SOLN
INTRAVENOUS | Status: DC | PRN
  Administered 2018-04-13: 30 mg via INTRAVENOUS
  Administered 2018-04-13: 20 mg via INTRAVENOUS

## 2018-04-13 MED ORDER — ARTIFICIAL TEARS OPHTHALMIC OINT
TOPICAL_OINTMENT | OPHTHALMIC | Status: AC
  Filled 2018-04-13: qty 3.5

## 2018-04-13 MED ORDER — POLYETHYLENE GLYCOL 3350 17 G PO PACK: 17.0000 g | PACK | Freq: Every day | ORAL | Status: DC | PRN

## 2018-04-13 MED ORDER — ROCURONIUM BROMIDE 50 MG/5ML IV SOSY
PREFILLED_SYRINGE | INTRAVENOUS | Status: AC
  Filled 2018-04-13: qty 10

## 2018-04-13 MED ORDER — ARTIFICIAL TEARS OPHTHALMIC OINT
TOPICAL_OINTMENT | OPHTHALMIC | Status: DC | PRN
  Administered 2018-04-13: 1 via OPHTHALMIC

## 2018-04-13 MED ORDER — BUPIVACAINE LIPOSOME 1.3 % IJ SUSP
20.0000 mL | INTRAMUSCULAR | Status: DC
  Filled 2018-04-13: qty 20

## 2018-04-13 MED ORDER — SODIUM CHLORIDE 0.9% FLUSH: 3.0000 mL | INTRAVENOUS | Status: DC | PRN

## 2018-04-13 MED ORDER — KETOROLAC TROMETHAMINE 30 MG/ML IJ SOLN
INTRAMUSCULAR | Status: DC | PRN
  Administered 2018-04-13: 30 mg via INTRAVENOUS

## 2018-04-13 MED ORDER — PROMETHAZINE HCL 25 MG/ML IJ SOLN: 6.2500 mg | INTRAMUSCULAR | Status: DC | PRN

## 2018-04-13 MED ORDER — ONDANSETRON HCL 4 MG/2ML IJ SOLN
4.0000 mg | Freq: Four times a day (QID) | INTRAMUSCULAR | Status: DC | PRN
  Administered 2018-04-13: 4 mg via INTRAVENOUS
  Filled 2018-04-13: qty 2

## 2018-04-13 MED ORDER — THROMBIN (RECOMBINANT) 20000 UNITS EX SOLR
CUTANEOUS | Status: AC
  Filled 2018-04-13: qty 20000

## 2018-04-13 MED ORDER — BUPIVACAINE HCL (PF) 0.5 % IJ SOLN
INTRAMUSCULAR | Status: DC | PRN
  Administered 2018-04-13: 5 mL

## 2018-04-13 MED ORDER — LIDOCAINE 2% (20 MG/ML) 5 ML SYRINGE
INTRAMUSCULAR | Status: DC | PRN
  Administered 2018-04-13: 40 mg via INTRAVENOUS

## 2018-04-13 MED ORDER — METHOCARBAMOL 500 MG PO TABS
500.0000 mg | ORAL_TABLET | Freq: Four times a day (QID) | ORAL | Status: DC | PRN
  Administered 2018-04-13 – 2018-04-14 (×3): 500 mg via ORAL
  Filled 2018-04-13 (×3): qty 1

## 2018-04-13 MED ORDER — 0.9 % SODIUM CHLORIDE (POUR BTL) OPTIME
TOPICAL | Status: DC | PRN
  Administered 2018-04-13: 1000 mL

## 2018-04-13 MED ORDER — DIAZEPAM 5 MG/ML IJ SOLN
2.5000 mg | Freq: Once | INTRAMUSCULAR | Status: AC
  Administered 2018-04-13: 2.5 mg via INTRAVENOUS

## 2018-04-13 SURGICAL SUPPLY — 75 items
BASKET BONE COLLECTION (BASKET) ×3 IMPLANT
BLADE CLIPPER SURG (BLADE) IMPLANT
BONE CANC CHIPS 20CC PCAN1/4 (Bone Implant) ×3 IMPLANT
BUR MATCHSTICK NEURO 3.0 LAGG (BURR) ×3 IMPLANT
BUR PRECISION FLUTE 5.0 (BURR) ×3 IMPLANT
CANISTER SUCT 3000ML PPV (MISCELLANEOUS) ×3 IMPLANT
CARTRIDGE OIL MAESTRO DRILL (MISCELLANEOUS) ×1 IMPLANT
CHIPS CANC BONE 20CC PCAN1/4 (Bone Implant) ×1 IMPLANT
CONT SPEC 4OZ CLIKSEAL STRL BL (MISCELLANEOUS) ×3 IMPLANT
COVER BACK TABLE 60X90IN (DRAPES) ×3 IMPLANT
COVER WAND RF STERILE (DRAPES) ×3 IMPLANT
DECANTER SPIKE VIAL GLASS SM (MISCELLANEOUS) ×3 IMPLANT
DERMABOND ADVANCED (GAUZE/BANDAGES/DRESSINGS) ×2
DERMABOND ADVANCED .7 DNX12 (GAUZE/BANDAGES/DRESSINGS) ×1 IMPLANT
DIFFUSER DRILL AIR PNEUMATIC (MISCELLANEOUS) ×3 IMPLANT
DRAPE C-ARM 42X72 X-RAY (DRAPES) ×3 IMPLANT
DRAPE C-ARMOR (DRAPES) ×3 IMPLANT
DRAPE LAPAROTOMY 100X72X124 (DRAPES) ×3 IMPLANT
DRAPE SURG 17X23 STRL (DRAPES) ×3 IMPLANT
DRSG OPSITE POSTOP 4X6 (GAUZE/BANDAGES/DRESSINGS) ×3 IMPLANT
DURAPREP 26ML APPLICATOR (WOUND CARE) ×3 IMPLANT
ELECT REM PT RETURN 9FT ADLT (ELECTROSURGICAL) ×3
ELECTRODE REM PT RTRN 9FT ADLT (ELECTROSURGICAL) ×1 IMPLANT
EVACUATOR 1/8 PVC DRAIN (DRAIN) IMPLANT
GAUZE 4X4 16PLY RFD (DISPOSABLE) IMPLANT
GAUZE SPONGE 4X4 12PLY STRL (GAUZE/BANDAGES/DRESSINGS) ×3 IMPLANT
GLOVE BIO SURGEON STRL SZ8 (GLOVE) ×9 IMPLANT
GLOVE BIO SURGEON STRL SZ8.5 (GLOVE) ×3 IMPLANT
GLOVE BIOGEL PI IND STRL 8 (GLOVE) ×2 IMPLANT
GLOVE BIOGEL PI IND STRL 8.5 (GLOVE) ×2 IMPLANT
GLOVE BIOGEL PI INDICATOR 8 (GLOVE) ×4
GLOVE BIOGEL PI INDICATOR 8.5 (GLOVE) ×4
GLOVE ECLIPSE 7.5 STRL STRAW (GLOVE) ×15 IMPLANT
GLOVE ECLIPSE 8.0 STRL XLNG CF (GLOVE) ×6 IMPLANT
GLOVE EXAM NITRILE XL STR (GLOVE) IMPLANT
GLOVE SURG SS PI 7.5 STRL IVOR (GLOVE) ×6 IMPLANT
GOWN STRL REUS W/ TWL LRG LVL3 (GOWN DISPOSABLE) IMPLANT
GOWN STRL REUS W/ TWL XL LVL3 (GOWN DISPOSABLE) ×2 IMPLANT
GOWN STRL REUS W/TWL 2XL LVL3 (GOWN DISPOSABLE) ×9 IMPLANT
GOWN STRL REUS W/TWL LRG LVL3 (GOWN DISPOSABLE)
GOWN STRL REUS W/TWL XL LVL3 (GOWN DISPOSABLE) ×4
HEMOSTAT POWDER KIT SURGIFOAM (HEMOSTASIS) ×3 IMPLANT
KIT BASIN OR (CUSTOM PROCEDURE TRAY) ×3 IMPLANT
KIT INFUSE XX SMALL 0.7CC (Orthopedic Implant) ×3 IMPLANT
KIT POSITION SURG JACKSON T1 (MISCELLANEOUS) ×3 IMPLANT
KIT TURNOVER KIT B (KITS) ×3 IMPLANT
MILL MEDIUM DISP (BLADE) ×3 IMPLANT
NEEDLE HYPO 21X1.5 SAFETY (NEEDLE) ×3 IMPLANT
NEEDLE HYPO 25X1 1.5 SAFETY (NEEDLE) ×3 IMPLANT
NEEDLE SPNL 18GX3.5 QUINCKE PK (NEEDLE) IMPLANT
NS IRRIG 1000ML POUR BTL (IV SOLUTION) ×3 IMPLANT
OIL CARTRIDGE MAESTRO DRILL (MISCELLANEOUS) ×3
PACK LAMINECTOMY NEURO (CUSTOM PROCEDURE TRAY) ×3 IMPLANT
PAD ARMBOARD 7.5X6 YLW CONV (MISCELLANEOUS) ×9 IMPLANT
PATTIES SURGICAL .5 X.5 (GAUZE/BANDAGES/DRESSINGS) IMPLANT
PATTIES SURGICAL .5 X1 (DISPOSABLE) IMPLANT
PATTIES SURGICAL 1X1 (DISPOSABLE) IMPLANT
PEEK COROENT TLIF 9X9X30 12D (Peek) ×3 IMPLANT
ROD RELINE LORDOTIC 5.5X45 (Rod) ×6 IMPLANT
SCREW LOCK RELINE 5.5 TULIP (Screw) ×12 IMPLANT
SCREW RELINE-O POLY 6.5X40 (Screw) ×6 IMPLANT
SCREW RELINE-O POLY 6.5X45 (Screw) ×6 IMPLANT
SPONGE LAP 4X18 RFD (DISPOSABLE) IMPLANT
SPONGE SURGIFOAM ABS GEL 100 (HEMOSTASIS) ×3 IMPLANT
STAPLER SKIN PROX WIDE 3.9 (STAPLE) ×3 IMPLANT
SUT VIC AB 1 CT1 18XBRD ANBCTR (SUTURE) ×1 IMPLANT
SUT VIC AB 1 CT1 8-18 (SUTURE) ×2
SUT VIC AB 2-0 CT1 18 (SUTURE) ×3 IMPLANT
SUT VIC AB 3-0 SH 8-18 (SUTURE) ×3 IMPLANT
SYR 20CC LL (SYRINGE) ×3 IMPLANT
SYR 5ML LL (SYRINGE) IMPLANT
TOWEL GREEN STERILE (TOWEL DISPOSABLE) ×3 IMPLANT
TOWEL GREEN STERILE FF (TOWEL DISPOSABLE) ×3 IMPLANT
TRAY FOLEY MTR SLVR 16FR STAT (SET/KITS/TRAYS/PACK) ×3 IMPLANT
WATER STERILE IRR 1000ML POUR (IV SOLUTION) ×3 IMPLANT

## 2018-04-13 NOTE — Anesthesia Procedure Notes (Signed)
Procedure Name: Intubation Date/Time: 04/13/2018 7:46 AM Performed by: Imagene Riches, CRNA Pre-anesthesia Checklist: Patient identified, Emergency Drugs available, Suction available and Patient being monitored Patient Re-evaluated:Patient Re-evaluated prior to induction Oxygen Delivery Method: Circle System Utilized Preoxygenation: Pre-oxygenation with 100% oxygen Induction Type: IV induction Ventilation: Mask ventilation without difficulty Laryngoscope Size: Miller and 3 Grade View: Grade I Tube type: Oral Tube size: 7.0 mm Number of attempts: 1 Airway Equipment and Method: Stylet and Oral airway Placement Confirmation: ETT inserted through vocal cords under direct vision,  positive ETCO2 and breath sounds checked- equal and bilateral Secured at: 23 cm Tube secured with: Tape Dental Injury: Teeth and Oropharynx as per pre-operative assessment

## 2018-04-13 NOTE — Interval H&P Note (Signed)
History and Physical Interval Note:  04/13/2018 7:31 AM  [place]  has presented today for surgery, with the diagnosis of DEGENERATIVE LUMBAR SPINAL STENOSIS  The various methods of treatment have been discussed with the patient and family. After consideration of risks, benefits and other options for treatment, the patient has consented to  Procedure(s) with comments: LUMBAR 5- SACRAL 1 POSTERIOR LUMBAR INTERBODY FUSION (NOT MAS) (N/A) - LUMBAR 5- SACRAL 1 POSTERIOR LUMBAR INTERBODY FUSION (NOT MAS) as a surgical intervention .  The patient's history has been reviewed, patient examined, no change in status, stable for surgery.  I have reviewed the patient's chart and labs.  Questions were answered to the patient's satisfaction.     Peggyann Shoals

## 2018-04-13 NOTE — H&P (Signed)
Patient ID:   [id number] Patient: Susan Taylor  Date of Birth: 08-20-1945 Visit Type: Office Visit   Date: 04/03/2018 12:30 PM Provider: Marchia Meiers. Vertell Limber MD   This 72 year old female presents for back pain.  HISTORY OF PRESENT ILLNESS:  1.  back pain  Susan Taylor, 72 year old retired female, visits for evaluation.  Patient reports left leg pain into the foot since March. Her pain is improved by laying down. She rates her pain at 3/10 while laying down and at 9/10 at its worst. She notes some tingling and numbness in her left foot.  She recalls no specific injury.  Currently, she notes pain with standing or walking.  Tramadol 50 mg taken rarely  Cortisone IM injection by Dr. Nelva Bush 3 weeks ago offered no relief Physical therapy caused increased pain  History:  Skin cancer squamous cell 2016, cholesterol, left leg clot? January 2019 followed by short course of anticoagulants Surgical history:  2016 skin cancer excision  Myelogram and CT on [place]          PAST MEDICAL/SURGICAL HISTORY:   (Detailed)    Disease/disorder Onset Date Management Date Comments    removal of squamous cell carcinoma    Cancer, skin    CRR 04/03/2018 -  high cholesterol         PAST MEDICAL HISTORY, SURGICAL HISTORY, FAMILY HISTORY, SOCIAL HISTORY AND REVIEW OF SYSTEMS I have reviewed the patient's past medical, surgical, family and social history as well as the comprehensive review of systems as included on the [facility] history form dated 04/03/2018, which I have signed.  Family History:  (Detailed) Relationship Family Member Name Deceased Age at Death Condition Onset Age Cause of Death  Father  Y  Cardiovascular disease  N  Mother  Y  high cholesterol  N  Mother  Y  Hypertension  N     Social History:  (Detailed) Tobacco use reviewed. Preferred language is [place].   Tobacco use status: Current non-smoker. Smoking status: Never smoker.  SMOKING  STATUS Type Smoking Status Usage Per Day Years Used Total Pack Years   Never smoker      VAPING USE Screened for vaping? No  TOBACCO/VAPING EXPOSURE No passive vaping exposure. No passive smoke exposure.   HOME ENVIRONMENT/SAFETY The patient is not at risk for falls. The patient has not fallen in the last year.     MEDICATIONS: (added, continued or stopped this visit) Started Medication Directions Instruction Stopped   aspirin  325mg  ORAL      atorvastatin 20 mg tablet take 1 tablet by oral route  every day     Calcium 500 500 mg calcium (1,250 mg) tablet     04/03/2018 Norco 5 mg-325 mg tablet take 1 tablet by oral route  every 6 hours as needed for pain     tramadol 50mg  BUCCAL TABLET ORL      Vitamin D3 1,000 unit tablet        ALLERGIES: Ingredient Reaction Medication Name Comment  NO KNOWN ALLERGIES     No known allergies.   REVIEW OF SYSTEMS   See scanned patient registration form, dated 04/03/2018, signed and dated on 04/03/2018  Review of Systems Details System Neg/Pos Details  Constitutional Negative Chills, Fatigue, Fever, Malaise, Night sweats, Weight gain and Weight loss.  ENMT Negative Ear drainage, Hearing loss, Nasal drainage, Otalgia, Sinus pressure and Sore throat.  Eyes Negative Eye discharge, Eye pain and Vision changes.  Respiratory Negative Chronic cough,  Cough, Dyspnea, Known TB exposure and Wheezing.  Cardio Negative Chest pain, Claudication, Edema and Irregular heartbeat/palpitations.  GI Negative Abdominal pain, Blood in stool, Change in stool pattern, Constipation, Decreased appetite, Diarrhea, Heartburn, Nausea and Vomiting.  GU Negative Dysuria, Hematuria, Polyuria (Genitourinary), Urinary frequency, Urinary incontinence and Urinary retention.  Endocrine Negative Cold intolerance, Heat intolerance, Polydipsia and Polyphagia.  Neuro Negative Dizziness, Extremity weakness, Gait disturbance, Headache, Memory impairment, Numbness in extremity,  Seizures and Tremors.  Psych Negative Anxiety, Depression and Insomnia.  Integumentary Negative Brittle hair, Brittle nails, Change in shape/size of mole(s), Hair loss, Hirsutism, Hives, Pruritus, Rash and Skin lesion.  MS Positive LLE pain.  Hema/Lymph Negative Easy bleeding, Easy bruising and Lymphadenopathy.  Allergic/Immuno Negative Contact allergy, Environmental allergies, Food allergies and Seasonal allergies.  Reproductive Negative Breast discharge, Breast lumps, Dysmenorrhea, Dyspareunia, History of abnormal PAP smear, Hot flashes, Irregular menses and Vaginal discharge.   PHYSICAL EXAM:   Vitals Date Temp F BP Pulse Ht In Wt Lb BMI BSA Pain Score  04/03/2018  185/109 99 67 191 29.91  6/10  04/03/2018  172/100 93 67    6/10    PHYSICAL EXAM Details General Level of Distress: no acute distress Overall Appearance: normal  Head and Face  Right Left  Fundoscopic Exam:  normal normal    Cardiovascular Cardiac: regular rate and rhythm without murmur  Right Left  Carotid Pulses: normal normal  Respiratory Lungs: clear to auscultation  Neurological Orientation: normal Recent and Remote Memory: normal Attention Span and Concentration:   normal Language: normal Fund of Knowledge: normal  Right Left Sensation: normal normal Upper Extremity Coordination: normal normal  Lower Extremity Coordination: normal normal  Musculoskeletal Gait and Station: normal  Right Left Upper Extremity Muscle Strength: normal normal Lower Extremity Muscle Strength: normal normal Upper Extremity Muscle Tone:  normal normal Lower Extremity Muscle Tone: normal normal   Motor Strength Upper and lower extremity motor strength was tested in the clinically pertinent muscles. Any abnormal findings will be noted below.   Right Left Tib Anterior:  4/5 EHL:  4/5   Deep Tendon  Reflexes  Right Left Biceps: normal normal Triceps: normal normal Brachioradialis: normal normal Patellar: normal normal Achilles: normal normal  Cranial Nerves II. Optic Nerve/Visual Fields: normal III. Oculomotor: normal IV. Trochlear: normal V. Trigeminal: normal VI. Abducens: normal VII. Facial: normal VIII. Acoustic/Vestibular: normal IX. Glossopharyngeal: normal X. Vagus: normal XI. Spinal Accessory: normal XII. Hypoglossal: normal  Motor and other Tests Lhermittes: negative Rhomberg: negative Pronator drift: absent     Right Left Hoffman's: normal normal Clonus: normal normal Babinski: normal normal SLR: negative positive at 30 degrees   Additional Findings:  Upon examination, able to stand on heels and toes, no weakness in bilateral upper extremities, 4/5 left hip abductor, full strength right lower extremity.    IMPRESSION:   L-spine CT reveals L5-S1 left-sided disc rupture with significant foraminal narrowing  AP + Lateral X-rays reveals severe lumbar levo-convex scoliosis, foraminal opening of 21.6 mm on right and 13.6 mm on left, flat back. Discussed surgical intervention - advised that a L5-S1 discectomy alone is unlikely to work alone - recommended L5-S1 PLIF - discussed possibility of problems developing at other levels within the lumbar spine - discussed how scoliosis contributed to ruptured disc - did not recommended surgical correction of curvature  Upon examination, able to stand on heels and toes, no weakness in bilateral upper extremities, 4/5 EHL on left, 4/5 dorsiflexion on left, negative SLR on right, positive SLR  on left at 30 degrees, 4/5 left hip abductor, full strength right lower extremity.  PLAN:  1. L5-S1 PLIF scheduled 2. Nurse education given 3. LSO brace fitted 4. Follow-up after surgery  Orders: Diagnostic Procedures: Assessment Procedure  M54.16 Lumbar Spine- AP/Lat  M54.16 Scoliosis- AP/Lat   Instruction(s)/Education: Assessment Instruction  I10 Hypertension education  Z68.29 Lifestyle education regarding diet  Miscellaneous: Assessment   M48.061 Aspen Lo Sag Rigid Panel Quick   Completed Orders (this encounter) Order Details Reason Side Interpretation Result Initial Treatment Date Region  Scoliosis- AP/Lat      04/03/2018 All Levels to All Levels  Hypertension education Continue to monitor blood pressure , if blood pressure continues elevated contact primary care        Lifestyle education regarding diet Patient encourage to eat a well balance diet         Assessment/Plan   # Detail Type Description   1. Assessment Idiopathic scoliosis of lumbar region (M41.26).       2. Assessment Low back pain, unspecified back pain laterality, with sciatica presence unspecified (M54.5).       3. Assessment Disc displacement, lumbar (M51.26).       4. Assessment Lumbar radiculopathy (M54.16).       5. Assessment Degenerative lumbar spinal stenosis (M48.061).   Plan Orders Aspen Lo Sag Rigid Panel Quick.       6. Assessment Essential (primary) hypertension (I10).       7. Assessment Body mass index (BMI) 29.0-29.9, adult (M08.67).   Plan Orders Today's instructions / counseling include(s) Lifestyle education regarding diet. Clinical information/comments: Patient encourage to eat a well balance diet.         Pain Management Plan Pain Scale: 6/10. Method: Numeric Pain Intensity Scale. Location: back. Onset: 09/01/2017. Duration: varies. Quality: discomforting. Pain management follow-up plan of care: Patient taking medication as prescribed.  Fall Risk Plan The patient has not fallen in the last year.     MEDICATIONS PRESCRIBED TODAY    Rx Quantity Refills  NORCO 5 mg-325 mg  60 0            Provider:  Marchia Meiers. Vertell Limber MD  04/03/2018 04:47 PM Dictation edited by: Mirian Mo    CC Providers: [facility]  [address],  [place]  [phone]   [place]  [address] [place], [place] [address]               Electronically signed by Marchia Meiers. Vertell Limber MD on 04/03/2018 04:49 PM

## 2018-04-13 NOTE — Brief Op Note (Signed)
04/13/2018  10:34 AM  PATIENT:  Susan Taylor  72 y.o. female  PRE-OPERATIVE DIAGNOSIS:  DEGENERATIVE LUMBAR SPINAL STENOSIS, herniated lumbar disc, scoliosis, radiculopathy, lumbago L 5 S 1 level  POST-OPERATIVE DIAGNOSIS:  DEGENERATIVE LUMBAR SPINAL STENOSIS, herniated lumbar disc, scoliosis, radiculopathy, lumbago L 5 S 1 level   PROCEDURE:  Procedure(s) with comments: LUMBAR FIVE- SACRAL ONE  TRANSFORAMINAL LUMBAR INTERBODY FUSION (N/A) - LUMBAR FIVE- SACRAL ONE TRANSFORAMINAL LUMBAR INTERBODY FUSION (NOT MAS) with PEEK cage, autograft, pedicle screw fixation, posterolateral arthrodesis  SURGEON:  Surgeon(s) and Role:    Erline Levine, MD - Primary    * Newman Pies, MD - Assisting  PHYSICIAN ASSISTANT:   ASSISTANTS: Poteat, RN   ANESTHESIA:   general  EBL:  50 mL   BLOOD ADMINISTERED:none  DRAINS: none   LOCAL MEDICATIONS USED:  MARCAINE    and LIDOCAINE   SPECIMEN:  No Specimen  DISPOSITION OF SPECIMEN:  N/A  COUNTS:  YES  TOURNIQUET:  * No tourniquets in log *   DICTATION: Patient is 72 year old woman with lumbar scoliosis and large lateral disc herniation L 5 S 1 left with severe left L 5 radiculopathy with lumbar stenosis. . It was elected to take her to surgery for decompression and fusion at this level.   Procedure: Patient was placed in a prone position on the [place] table after smooth and uncomplicated induction of general endotracheal anesthesia. Her low back was prepped and draped in usual sterile fashion with betadine scrub and DuraPrep after marking pedicles of L 5 and S 1 with C -arm. Area of incision was infiltrated with local lidocaine. Incision was made to the lumbodorsal fascia was incised and exposure was performed of the L 5 through S 1 spinous processes laminae facet joint and transverse processes. Intraoperative x-ray was obtained which confirmed correct orientation. A left hemilaminectomy of L 5  was performed with disarticulation of the  facet joints at this level and thorough decompression was performed of both L 5 and S 1 nerve roots along with the common dural tube. This decompression was more involved than would be typical of that performed for PLIF alone and included painstaking dissection of adherent ligament compressing the thecal sac and wide decompression of all neural elements. A large lateral disc herniation was carefully removed with decompression of L 5 nerve root.  A thorough discectomy was initially performed on the left with preparation of the endplates for grafting a trial TLIF spacer was placed this level. Extra extra small BMP was packed within the interspace and a Coroent Anterior TLIF 9 x 9 x 30 12 degrees  was packed with BMP and autograft and was inserted the interspace and countersunk appropriately along with 6 cc of morselized bone autograft. The posterolateral region was extensively decorticated and pedicle probes were placed at L 5 and  S 1 bilaterally. Intraoperative fluoroscopy confirmed correct orientationin the AP and lateral plane. 40 x 6.5 mm pedicle screws were placed at S 1 bilaterally and 45 x 6.5 mm screws placed at L 5 bilaterally final x-rays demonstrated well-positioned interbody grafts and pedicle screw fixation. A 45 mm lordotic rod was placed on the right and a 45 mm rod was placed on the left locked down in compression on the right and distraction on the left and the posterolateral region was packed with the remaining 30 cc autograft and allograft. The wound was irrigated and long-acting Marcaine was injected into the deep musculature. Fascia was closed with 1 Vicryl sutures,  subcutaneous tissues were reapproximated with 2-0 Vicryl sutures and skin edges were reapproximated 3-0 Vicryl sutures. The wound is dressed with Dermabond and an occlusive dressing.  the patient was extubated in the operating room and taken to recovery in stable satisfactory condition. She tolerated the operation well counts were  correct at the end of the case.   PLAN OF CARE: Admit to inpatient   PATIENT DISPOSITION:  PACU - hemodynamically stable.   Delay start of Pharmacological VTE agent (>24hrs) due to surgical blood loss or risk of bleeding: yes

## 2018-04-13 NOTE — Transfer of Care (Signed)
Immediate Anesthesia Transfer of Care Note  Patient: Susan Taylor  Procedure(s) Performed: LUMBAR FIVE- SACRAL ONE  TRANSFORAMINAL LUMBAR INTERBODY FUSION (N/A Back)  Patient Location: PACU  Anesthesia Type:General  Level of Consciousness: awake, alert  and oriented  Airway & Oxygen Therapy: Patient Spontanous Breathing and Patient connected to nasal cannula oxygen  Post-op Assessment: Report given to RN and Post -op Vital signs reviewed and stable  Post vital signs: Reviewed and stable  Last Vitals:  Vitals Value Taken Time  BP 168/100 04/13/2018 10:36 AM  Temp    Pulse 93 04/13/2018 10:39 AM  Resp 13 04/13/2018 10:39 AM  SpO2 94 % 04/13/2018 10:39 AM  Vitals shown include unvalidated device data.  Last Pain:  Vitals:   04/13/18 0629  TempSrc:   PainSc: 3          Complications: No apparent anesthesia complications

## 2018-04-13 NOTE — Op Note (Signed)
04/13/2018  10:34 AM  PATIENT:  Susan Taylor  72 y.o. female  PRE-OPERATIVE DIAGNOSIS:  DEGENERATIVE LUMBAR SPINAL STENOSIS, herniated lumbar disc, scoliosis, radiculopathy, lumbago L 5 S 1 level  POST-OPERATIVE DIAGNOSIS:  DEGENERATIVE LUMBAR SPINAL STENOSIS, herniated lumbar disc, scoliosis, radiculopathy, lumbago L 5 S 1 level   PROCEDURE:  Procedure(s) with comments: LUMBAR FIVE- SACRAL ONE  TRANSFORAMINAL LUMBAR INTERBODY FUSION (N/A) - LUMBAR FIVE- SACRAL ONE TRANSFORAMINAL LUMBAR INTERBODY FUSION (NOT MAS) with PEEK cage, autograft, pedicle screw fixation, posterolateral arthrodesis  SURGEON:  Surgeon(s) and Role:    Erline Levine, MD - Primary    * Newman Pies, MD - Assisting  PHYSICIAN ASSISTANT:   ASSISTANTS: Poteat, RN   ANESTHESIA:   general  EBL:  50 mL   BLOOD ADMINISTERED:none  DRAINS: none   LOCAL MEDICATIONS USED:  MARCAINE    and LIDOCAINE   SPECIMEN:  No Specimen  DISPOSITION OF SPECIMEN:  N/A  COUNTS:  YES  TOURNIQUET:  * No tourniquets in log *   DICTATION: Patient is 72 year old woman with lumbar scoliosis and large lateral disc herniation L 5 S 1 left with severe left L 5 radiculopathy with lumbar stenosis. . It was elected to take her to surgery for decompression and fusion at this level.   Procedure: Patient was placed in a prone position on the [place] table after smooth and uncomplicated induction of general endotracheal anesthesia. Her low back was prepped and draped in usual sterile fashion with betadine scrub and DuraPrep after marking pedicles of L 5 and S 1 with C -arm. Area of incision was infiltrated with local lidocaine. Incision was made to the lumbodorsal fascia was incised and exposure was performed of the L 5 through S 1 spinous processes laminae facet joint and transverse processes. Intraoperative x-ray was obtained which confirmed correct orientation. A left hemilaminectomy of L 5  was performed with disarticulation of the  facet joints at this level and thorough decompression was performed of both L 5 and S 1 nerve roots along with the common dural tube. This decompression was more involved than would be typical of that performed for PLIF alone and included painstaking dissection of adherent ligament compressing the thecal sac and wide decompression of all neural elements. A large lateral disc herniation was carefully removed with decompression of L 5 nerve root.  A thorough discectomy was initially performed on the left with preparation of the endplates for grafting a trial TLIF spacer was placed this level. Extra extra small BMP was packed within the interspace and a Coroent Anterior TLIF 9 x 9 x 30 12 degrees  was packed with BMP and autograft and was inserted the interspace and countersunk appropriately along with 6 cc of morselized bone autograft. The posterolateral region was extensively decorticated and pedicle probes were placed at L 5 and  S 1 bilaterally. Intraoperative fluoroscopy confirmed correct orientationin the AP and lateral plane. 40 x 6.5 mm pedicle screws were placed at S 1 bilaterally and 45 x 6.5 mm screws placed at L 5 bilaterally final x-rays demonstrated well-positioned interbody grafts and pedicle screw fixation. A 45 mm lordotic rod was placed on the right and a 45 mm rod was placed on the left locked down in compression on the right and distraction on the left and the posterolateral region was packed with the remaining 30 cc autograft and allograft. The wound was irrigated and long-acting Marcaine was injected into the deep musculature. Fascia was closed with 1 Vicryl sutures,  subcutaneous tissues were reapproximated with 2-0 Vicryl sutures and skin edges were reapproximated 3-0 Vicryl sutures. The wound is dressed with Dermabond and an occlusive dressing.  the patient was extubated in the operating room and taken to recovery in stable satisfactory condition. She tolerated the operation well counts were  correct at the end of the case.   PLAN OF CARE: Admit to inpatient   PATIENT DISPOSITION:  PACU - hemodynamically stable.   Delay start of Pharmacological VTE agent (>24hrs) due to surgical blood loss or risk of bleeding: yes

## 2018-04-13 NOTE — Anesthesia Postprocedure Evaluation (Signed)
Anesthesia Post Note  Patient: Katrina Daddona Viramontes  Procedure(s) Performed: LUMBAR FIVE- SACRAL ONE  TRANSFORAMINAL LUMBAR INTERBODY FUSION (N/A Back)     Patient location during evaluation: PACU Anesthesia Type: General Level of consciousness: awake and alert, oriented and patient cooperative Pain management: pain level controlled Vital Signs Assessment: post-procedure vital signs reviewed and stable Respiratory status: spontaneous breathing, nonlabored ventilation and respiratory function stable Cardiovascular status: blood pressure returned to baseline and stable Postop Assessment: no apparent nausea or vomiting Anesthetic complications: no    Last Vitals:  Vitals:   04/13/18 1151 04/13/18 1223  BP: (!) 151/81 (!) 158/96  Pulse: 85 91  Resp: (!) 9 16  Temp: (!) 36.3 C 36.7 C  SpO2: 93% 92%    Last Pain:  Vitals:   04/13/18 1223  TempSrc: Oral  PainSc:                  Keiera Strathman,E. Ava Deguire

## 2018-04-14 MED ORDER — OXYCODONE HCL 5 MG PO TABS: 5.0000 mg | ORAL_TABLET | ORAL | 0 refills | Status: AC | PRN

## 2018-04-14 MED ORDER — DOCUSATE SODIUM 100 MG PO CAPS: 100.0000 mg | ORAL_CAPSULE | Freq: Two times a day (BID) | ORAL | 0 refills | Status: AC

## 2018-04-14 MED ORDER — METHOCARBAMOL 500 MG PO TABS: 500.0000 mg | ORAL_TABLET | Freq: Four times a day (QID) | ORAL | 0 refills | Status: AC | PRN

## 2018-04-14 MED ORDER — GABAPENTIN 300 MG PO CAPS: 300.0000 mg | ORAL_CAPSULE | Freq: Three times a day (TID) | ORAL | 0 refills | Status: AC

## 2018-04-14 MED FILL — Thrombin (Recombinant) For Soln 20000 Unit: CUTANEOUS | Qty: 1 | Status: AC

## 2018-04-14 MED FILL — Heparin Sodium (Porcine) Inj 1000 Unit/ML: INTRAMUSCULAR | Qty: 30 | Status: AC

## 2018-04-14 MED FILL — Sodium Chloride IV Soln 0.9%: INTRAVENOUS | Qty: 1000 | Status: AC

## 2018-04-14 NOTE — Evaluation (Signed)
Physical Therapy Evaluation and Discharge Patient Details Name: Susan Taylor MRN: [id number] DOB: [birth year] Today's Date: 04/14/2018   History of Present Illness  Pt is a 72 y.o female with a PMH consisting of squamous cell carcinoma presents with LLE numbness, and LB pain that radiates into the LLE. Pt s/p L5-S1 TLIF on 04/13/2018 due to scoliosis, herniated disc, and radiculopathy.   Clinical Impression  Patient evaluated by Physical Therapy with no further acute PT needs identified. All education has been completed and the patient has no further questions. Pt is overall min guard A for transfers, ambulation and stairs and progressed to supervision level by the end of the session. Pt has husband at home for supervision during mobility once d/c. Pt reports decreased pain during ambulation compared to sitting secondary to wearing the back brace. Pt educated on all lumbar precautions, including log rolling, car transfers, and safe activity progression. See below for any follow-up Physical Therapy or equipment needs. PT is signing off. Thank you for this referral.     Follow Up Recommendations No PT follow up    Equipment Recommendations  None recommended by PT    Recommendations for Other Services       Precautions / Restrictions Precautions Precautions: Back Precaution Booklet Issued: Yes (comment) Precaution Comments: reviewed back precautions related to ADL and IADL Required Braces or Orthoses: Spinal Brace Spinal Brace: Lumbar corset;Applied in sitting position Restrictions Weight Bearing Restrictions: No      Mobility  Bed Mobility Overal bed mobility: Needs Assistance Bed Mobility: Rolling;Sidelying to Sit Rolling: Supervision Sidelying to sit: Supervision     Sit to sidelying: Supervision General bed mobility comments: Verbal cueing given to maintain precautions during log roll. Pt required increased time and effort. Pt required use of bed rail in order to complete  rolling and sidelying to sit.   Transfers Overall transfer level: Needs assistance Equipment used: None Transfers: Sit to/from Stand Sit to Stand: Min guard         General transfer comment: Increased time and effort needed. Min guard for safety.   Ambulation/Gait Ambulation/Gait assistance: Min guard Gait Distance (Feet): 300 Feet Assistive device: None Gait Pattern/deviations: Decreased step length - right;Decreased step length - left     General Gait Details: Pt reported decreased pain during walking as compared prior to surgery. Pt states gait feels back to normal without deviations. Pt demonstrates decreased walking speed with decreased step length. Verbal cueing for upright posture throughout.   Stairs Stairs: Yes Stairs assistance: Min guard Stair Management: One rail Right;Step to pattern;Alternating pattern Number of Stairs: 10 General stair comments: Pt demonstrated alternating pattern ascending steps for 6 steps, then switched to step to pattern, leading with the LLE. Pt reported decreased pain using step to pattern. Pt used step to pattern leading with RLE on descent for 6 steps. Pt switched to alternating pattern for last 4 steps, with no increased pain. Verbal cueing given throughout to maintain posture. R handrail used throughout, and min guard A used for safety.   Wheelchair Mobility    Modified Rankin (Stroke Patients Only)       Balance Overall balance assessment: Independent Sitting-balance support: Feet supported Sitting balance-Leahy Scale: Good Sitting balance - Comments: Pt able to maintain upright posture while EOB   Standing balance support: No upper extremity supported Standing balance-Leahy Scale: Good Standing balance comment: Pt able to maintain standing balance support without assistance. Maximum challenge was not tested.  Pertinent Vitals/Pain Pain Assessment: Faces Faces Pain Scale: Hurts little  more Pain Location: back Pain Descriptors / Indicators: Operative site guarding;Discomfort Pain Intervention(s): Monitored during session;Repositioned    Home Living Family/patient expects to be discharged to:: Private residence Living Arrangements: Spouse/significant other Available Help at Discharge: Family;Available 24 hours/day Type of Home: House Home Access: Ramped entrance     Home Layout: Two level;Able to live on main level with bedroom/bathroom Home Equipment: Shower seat;Grab bars - toilet      Prior Function Level of Independence: Independent               Hand Dominance   Dominant Hand: Right    Extremity/Trunk Assessment   Upper Extremity Assessment Upper Extremity Assessment: Overall WFL for tasks assessed    Lower Extremity Assessment Lower Extremity Assessment: Overall WFL for tasks assessed       Communication   Communication: No difficulties  Cognition Arousal/Alertness: Awake/alert Behavior During Therapy: WFL for tasks assessed/performed Overall Cognitive Status: Within Functional Limits for tasks assessed                                        General Comments      Exercises     Assessment/Plan    PT Assessment Patent does not need any further PT services  PT Problem List Decreased strength;Decreased mobility;Decreased coordination;Decreased activity tolerance;Decreased balance       PT Treatment Interventions      PT Goals (Current goals can be found in the Care Plan section)  Acute Rehab PT Goals Patient Stated Goal: return home PT Goal Formulation: With patient Time For Goal Achievement: 04/28/18 Potential to Achieve Goals: Good    Frequency     Barriers to discharge        Co-evaluation               AM-PAC PT "6 Clicks" Daily Activity  Outcome Measure Difficulty turning over in bed (including adjusting bedclothes, sheets and blankets)?: A Little Difficulty moving from lying on back to  sitting on the side of the bed? : A Little Difficulty sitting down on and standing up from a chair with arms (e.g., wheelchair, bedside commode, etc,.)?: None Help needed moving to and from a bed to chair (including a wheelchair)?: None Help needed walking in hospital room?: None Help needed climbing 3-5 steps with a railing? : A Little 6 Click Score: 21    End of Session Equipment Utilized During Treatment: Back brace;Gait belt Activity Tolerance: Patient tolerated treatment well Patient left: in chair;with call bell/phone within reach;with family/visitor present Nurse Communication: Mobility status PT Visit Diagnosis: Other abnormalities of gait and mobility (R26.89);Difficulty in walking, not elsewhere classified (R26.2)    Time: 3016-0109 PT Time Calculation (min) (ACUTE ONLY): 19 min   Charges:   PT Evaluation $PT Eval Moderate Complexity: 1 Mod          Elmina Hendel Lawrence, SPT Acute Rehab [phone] (pager) [phone] (office)  Korayma Hagwood 04/14/2018, 11:56 AM

## 2018-04-14 NOTE — Discharge Summary (Signed)
Physician Discharge Summary  Patient ID: Susan Taylor MRN: [id number] DOB/AGE: 71-Jun-1947 71 y.o.  Admit date: 04/13/2018 Discharge date: 04/14/2018  Admission Diagnoses: Lumbar scoliosis, lumbosacral herniated disc, lumbosacral radiculopathy  Discharge Diagnoses: The same   Active Problems:   Lumbar scoliosis   Discharged Condition: good  Hospital Course: Dr. Vertell Limber performed an L5-S1 decompression, instrumentation and fusion on the patient on 04/13/2018.  The patient's postoperative course was unremarkable.  On postoperative day #1 she requested discharge to home.  She was given oral and written discharge instructions.  All her questions were answered.  Consults: Physical therapy Significant Diagnostic Studies: None Treatments: L5-S1 decompression, instrumentation and fusion Discharge Exam: Blood pressure 123/68, pulse 68, temperature 97.9 F (36.6 C), temperature source Oral, resp. rate 18, SpO2 96 %. The patient is alert and pleasant.  She looks well.  Her strength is normal.  Disposition: Home  Discharge Instructions    Call MD for:  difficulty breathing, headache or visual disturbances   Complete by:  As directed    Call MD for:  extreme fatigue   Complete by:  As directed    Call MD for:  hives   Complete by:  As directed    Call MD for:  persistant dizziness or light-headedness   Complete by:  As directed    Call MD for:  persistant nausea and vomiting   Complete by:  As directed    Call MD for:  redness, tenderness, or signs of infection (pain, swelling, redness, odor or green/yellow discharge around incision site)   Complete by:  As directed    Call MD for:  severe uncontrolled pain   Complete by:  As directed    Call MD for:  temperature >100.4   Complete by:  As directed    Diet - low sodium heart healthy   Complete by:  As directed    Discharge instructions   Complete by:  As directed    Call [phone] for a followup appointment. Take a stool  softener while you are using pain medications.   Driving Restrictions   Complete by:  As directed    Do not drive for 2 weeks.   Increase activity slowly   Complete by:  As directed    Lifting restrictions   Complete by:  As directed    Do not lift more than 5 pounds. No excessive bending or twisting.   May shower / Bathe   Complete by:  As directed    Remove the dressing for 3 days after surgery.  You may shower, but leave the incision alone.   Remove dressing in 48 hours   Complete by:  As directed    Your stitches are under the scan and will dissolve by themselves. The Steri-Strips will fall off after you take a few showers. Do not rub back or pick at the wound, Leave the wound alone.     Allergies as of 04/14/2018   No Known Allergies     Medication List    STOP taking these medications   HYDROcodone-acetaminophen 5-325 MG tablet Commonly known as:  NORCO/VICODIN     TAKE these medications   aspirin 325 MG tablet Take 325 mg by mouth daily.   atorvastatin 20 MG tablet Commonly known as:  LIPITOR Take 20 mg by mouth daily.   CALCIUM 1200 PO Take 1,200 mg by mouth daily.   cholecalciferol 1000 units tablet Commonly known as:  VITAMIN D Take 1,000 Units by mouth daily.  docusate sodium 100 MG capsule Commonly known as:  COLACE Take 1 capsule (100 mg total) by mouth 2 (two) times daily.   gabapentin 300 MG capsule Commonly known as:  NEURONTIN Take 1 capsule (300 mg total) by mouth 3 (three) times daily.   methocarbamol 500 MG tablet Commonly known as:  ROBAXIN Take 1 tablet (500 mg total) by mouth every 6 (six) hours as needed for muscle spasms.   oxyCODONE 5 MG immediate release tablet Commonly known as:  Oxy IR/ROXICODONE Take 1 tablet (5 mg total) by mouth every 4 (four) hours as needed for moderate pain ((score 4 to 6)).        Signed: Ophelia Charter 04/14/2018, 7:38 AM

## 2018-04-14 NOTE — Evaluation (Signed)
Occupational Therapy Evaluation Patient Details Name: Susan Taylor MRN: [id number] DOB: 1946/04/20 Today's Date: 04/14/2018    History of Present Illness s/p L5-S1 TLIF due to scoliosis, herniated disc, radiculopathy   Clinical Impression   Pt educated in back precautions related to bed mobility, ADL and IADL and reinforced with written handout. Pt verbalizing and/or demonstrating understanding. No further OT needs.    Follow Up Recommendations  No OT follow up    Equipment Recommendations  None recommended by OT    Recommendations for Other Services       Precautions / Restrictions Precautions Precautions: Back Precaution Booklet Issued: Yes (comment) Precaution Comments: reviewed back precautions related to ADL and IADL Required Braces or Orthoses: Spinal Brace Spinal Brace: Applied in sitting position;Lumbar corset Restrictions Weight Bearing Restrictions: No      Mobility Bed Mobility Overal bed mobility: Needs Assistance Bed Mobility: Sit to Sidelying         Sit to sidelying: Supervision General bed mobility comments: instructed in log roll technique  Transfers Overall transfer level: Modified independent Equipment used: None             General transfer comment: increased time, but no physical assist    Balance Overall balance assessment: Mild deficits observed, not formally tested                                         ADL either performed or assessed with clinical judgement   ADL Overall ADL's : Modified independent                                       General ADL Comments: Educated pt in back precautions related to ADL and IADL and body mechanics during IADL. Pt will have husband available to assist as needed.     Vision Patient Visual Report: No change from baseline       Perception     Praxis      Pertinent Vitals/Pain Pain Assessment: Faces Faces Pain Scale: Hurts little more Pain  Location: back Pain Descriptors / Indicators: Sore Pain Intervention(s): Monitored during session;Premedicated before session;Repositioned     Hand Dominance Right   Extremity/Trunk Assessment Upper Extremity Assessment Upper Extremity Assessment: Overall WFL for tasks assessed   Lower Extremity Assessment Lower Extremity Assessment: Defer to PT evaluation       Communication Communication Communication: No difficulties   Cognition Arousal/Alertness: Awake/alert Behavior During Therapy: WFL for tasks assessed/performed Overall Cognitive Status: Within Functional Limits for tasks assessed                                     General Comments       Exercises     Shoulder Instructions      Home Living Family/patient expects to be discharged to:: Private residence Living Arrangements: Spouse/significant other Available Help at Discharge: Family;Available 24 hours/day Type of Home: House Home Access: Ramped entrance     Home Layout: Two level;Able to live on main level with bedroom/bathroom;Full bath on main level     Bathroom Shower/Tub: Occupational psychologist: Standard     Home Equipment: Shower seat;Grab bars - toilet;Grab bars - tub/shower  Prior Functioning/Environment Level of Independence: Independent                 OT Problem List:        OT Treatment/Interventions:      OT Goals(Current goals can be found in the care plan section) Acute Rehab OT Goals Patient Stated Goal: return to PLOF  OT Frequency:     Barriers to D/C:            Co-evaluation              AM-PAC PT "6 Clicks" Daily Activity     Outcome Measure Help from another person eating meals?: None Help from another person taking care of personal grooming?: None Help from another person toileting, which includes using toliet, bedpan, or urinal?: None Help from another person bathing (including washing, rinsing, drying)?: None Help  from another person to put on and taking off regular upper body clothing?: None Help from another person to put on and taking off regular lower body clothing?: None 6 Click Score: 24   End of Session Equipment Utilized During Treatment: Gait belt;Back brace  Activity Tolerance: Patient tolerated treatment well Patient left: in bed;with call bell/phone within reach;with family/visitor present  OT Visit Diagnosis: Pain                Time: 5284-1324 OT Time Calculation (min): 17 min Charges:  OT General Charges $OT Visit: 1 Visit OT Evaluation $OT Eval Low Complexity: 1 Low  Nestor Lewandowsky, OTR/L Acute Rehabilitation Services Pager: [phone] Office: [phone]  Malka So 04/14/2018, 8:09 AM

## 2018-04-14 NOTE — Progress Notes (Signed)
Patient discharged with Husband via wheelchair with discharge packet that included Post op  appointment, RX for Percocet and Valium.

## 2019-07-07 ENCOUNTER — Ambulatory Visit: Payer: Medicare Other

## 2019-07-12 ENCOUNTER — Ambulatory Visit: Payer: Medicare PPO | Attending: Internal Medicine

## 2019-07-12 DIAGNOSIS — Z23 Encounter for immunization: Secondary | ICD-10-CM | POA: Insufficient documentation

## 2019-07-12 NOTE — Progress Notes (Signed)
   Covid-19 Vaccination Clinic  Name:  Susan Taylor    MRN: [id number] DOB: [birth year]  07/12/2019  [name] was observed post Covid-19 immunization for 15 minutes without incidence. She was provided with Vaccine Information Sheet and instruction to access the V-Safe system.   [name] was instructed to call 911 with any severe reactions post vaccine: Marland Kitchen Difficulty breathing  . Swelling of your face and throat  . A fast heartbeat  . A bad rash all over your body  . Dizziness and weakness    Immunizations Administered    Name Date Dose VIS Date Route   Pfizer COVID-19 Vaccine 07/12/2019  9:41 AM 0.3 mL 05/18/2019 Intramuscular   Manufacturer: [place]   Lot: [id number]   [place]: [id number]

## 2019-07-18 ENCOUNTER — Ambulatory Visit: Payer: Medicare Other

## 2019-08-06 ENCOUNTER — Ambulatory Visit: Payer: Medicare PPO | Attending: Internal Medicine

## 2019-08-06 DIAGNOSIS — Z23 Encounter for immunization: Secondary | ICD-10-CM | POA: Insufficient documentation

## 2019-08-06 NOTE — Progress Notes (Signed)
   Covid-19 Vaccination Clinic  Name:  Susan Taylor    MRN: [id number] DOB: 1946/01/29  08/06/2019  Ms. Fernicola was observed post Covid-19 immunization for 15 minutes without incidence. She was provided with Vaccine Information Sheet and instruction to access the V-Safe system.   Ms. Mouton was instructed to call 911 with any severe reactions post vaccine: Marland Kitchen Difficulty breathing  . Swelling of your face and throat  . A fast heartbeat  . A bad rash all over your body  . Dizziness and weakness    Immunizations Administered    Name Date Dose VIS Date Route   Pfizer COVID-19 Vaccine 08/06/2019  8:59 AM 0.3 mL 05/18/2019 Intramuscular   Manufacturer: [place]   Lot: [id number]   [place]: [id number]

## 2020-07-09 IMAGING — CT CT L SPINE W/ CM
1 of 7 series · 5 of 14 positions shown, 7 images · IV contrast (omnipaque)
Comparison: None

CLINICAL DATA: Low back and left lower extremity pain. Scoliosis.
No previous lumbar surgery.

EXAM:
LUMBAR MYELOGRAM
CT LUMBAR SPINE WITH INTRATHECAL CONTRAST
FLUOROSCOPY TIME:  20 seconds; 267  uAymQ DAP
TECHNIQUE: The procedure, risks (including but not limited to bleeding,
infection, organ damage ), benefits, and alternatives were explained
to the patient. Questions regarding the procedure were encouraged
and answered. The patient understands and consents to the procedure.
An appropriate entry site was determined under fluoroscopy. Operator
donned sterile gloves and mask. Skin site was marked, prepped with
Betadine, and draped in usual sterile fashion, and infiltrated
locally with 1% lidocaine. A 22 gauge spinal needle was advanced
into the thecal sac at L3-4 from a left interlaminar approach. Clear
colorless CSF returned. 17 ml Omnipaque 180 were administered
intrathecally for lumbar myelography, followed by axial CT scanning
of the lumbar spine. I personally performed the lumbar puncture and
administered the intrathecal contrast. I also personally supervised
acquisition of the myelogram images. Coronal and sagittal
reconstructions were generated from the axial scan.

[Series 2: l spine soft · axial · 0.36mm/px · z∈[-298,-142]mm · 5 of 78 slices shown, 7 images]
[im 13/78  soft-tissue]
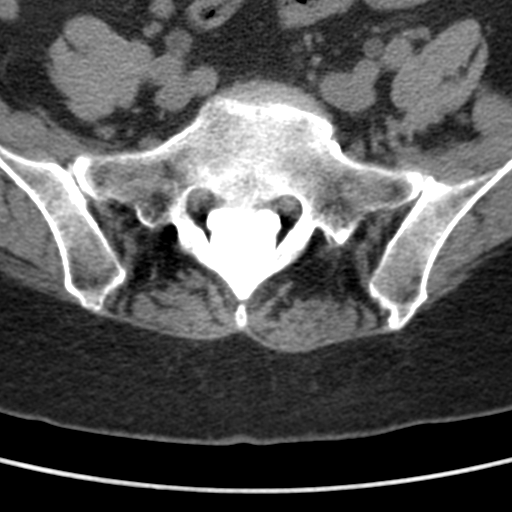
[im 13/78  bone]
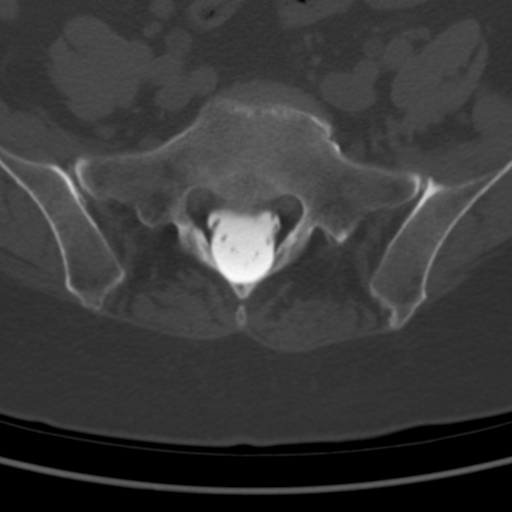
[im 26/78  bone]
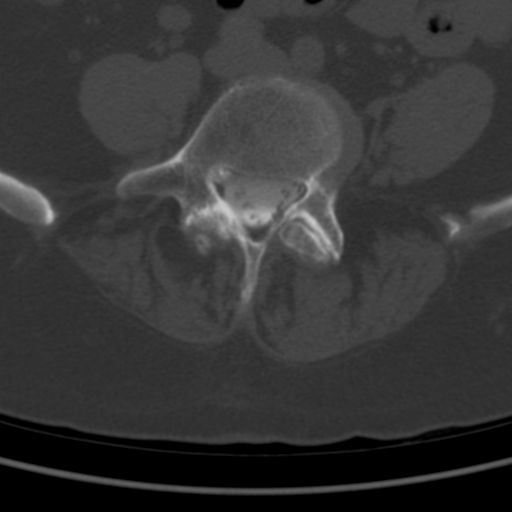
[im 39/78  bone]
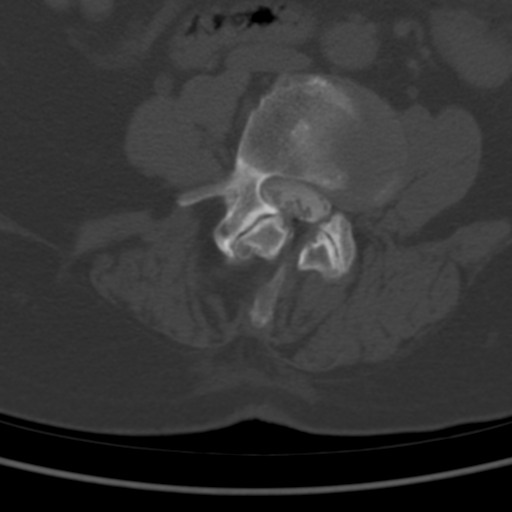
[im 52/78  bone]
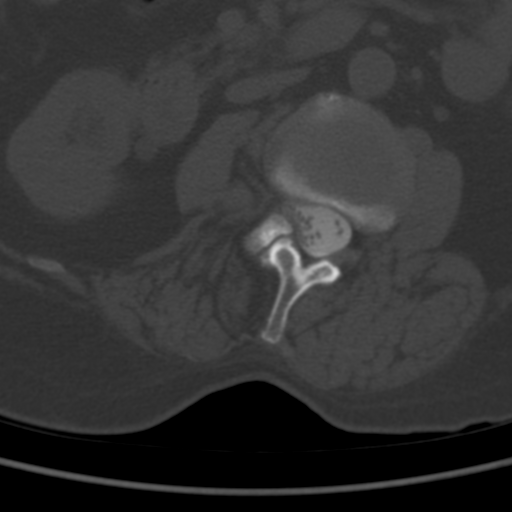
[im 65/78  soft-tissue]
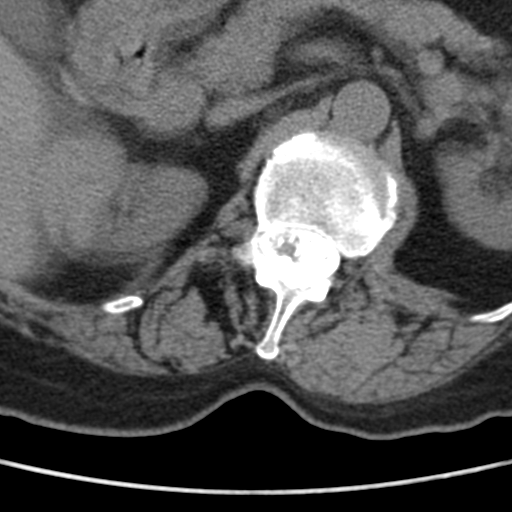
[im 65/78  bone]
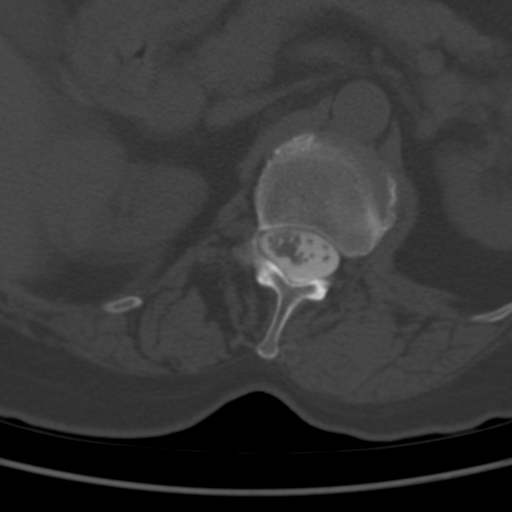

[5 of 14 positions shown; findings below may reference images not displayed]

FINDINGS: Lumbar levoscoliosis apex L2-3. Negative for fracture or
spondylolisthesis. No dynamic instability on standing lateral
flexion/extension films.

T12-L1: Minimal endplate spurring anteriorly. No disc bulge or
protrusion. Central canal and foramina patent.

L1-2: No disc bulge or protrusion. Central canal and foramina
patent. Conus terminates at the interspace.

L2-3: No disc bulge or protrusion. Central canal and foramina
patent.

L3-4: Mild circumferential disc bulge. Asymmetric facet DJD right
worse than left. Mild narrowing of the AP diameter of the thecal
sac. No foraminal stenosis.

L4-5: Mild circumferential disc bulge. Advanced facet DJD right
worse than left. No spinal stenosis. Foramina patent.

L5-S1: Broad left foraminal protrusion, effacing the left neural
foramen. Asymmetric facet DJD left worse than right. No spinal
stenosis. Right foramen widely patent.

Mild scattered aortoiliac arterial calcifications. Remainder
visualized paraspinal soft tissues unremarkable.
IMPRESSION: 1. Left L5-S1 foraminal effacement secondary to broad left
protrusion.
2. Mild multifactorial spinal stenosis L3-4
3. Lumbar levoscoliosis with multilevel asymmetric facet DJD L3-S1
as above.
